# Patient Record
Sex: Male | Born: 1980 | Race: Black or African American | Hispanic: No | Marital: Single | Smoking: Never smoker
Health system: Southern US, Community
[De-identification: ages and names within clinical notes are randomized; demographics above are authoritative.]

## PROBLEM LIST (undated history)

## (undated) HISTORY — PX: ROOT CANAL: SHX2363

---
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
## 2001-04-04 ENCOUNTER — Encounter: Payer: Self-pay | Admitting: Emergency Medicine

## 2001-04-04 ENCOUNTER — Emergency Department (HOSPITAL_COMMUNITY): Admission: EM | Admit: 2001-04-04 | Discharge: 2001-04-04 | Payer: Self-pay | Admitting: Emergency Medicine

## 2005-01-18 ENCOUNTER — Emergency Department (HOSPITAL_COMMUNITY): Admission: EM | Admit: 2005-01-18 | Discharge: 2005-01-18 | Payer: Self-pay | Admitting: Emergency Medicine

## 2007-12-13 ENCOUNTER — Emergency Department (HOSPITAL_COMMUNITY): Admission: EM | Admit: 2007-12-13 | Discharge: 2007-12-13 | Payer: Self-pay | Admitting: Family Medicine

## 2008-03-11 ENCOUNTER — Emergency Department (HOSPITAL_COMMUNITY): Admission: EM | Admit: 2008-03-11 | Discharge: 2008-03-11 | Payer: Self-pay | Admitting: Emergency Medicine

## 2008-07-22 ENCOUNTER — Emergency Department (HOSPITAL_COMMUNITY): Admission: EM | Admit: 2008-07-22 | Discharge: 2008-07-23 | Payer: Self-pay | Admitting: Emergency Medicine

## 2011-04-19 NOTE — Discharge Summary (Signed)
[facility]  Patient:    Johnny Harper, Johnny Harper                    MRN: [id number] Proc. Date: 08/12/00 Adm. Date:  [id number] Disc. Date: [id number] Attending:  Gailen Shelter Dictator:   Earley Favor, RN, MSN, ACNP CC:         Adelene Amas. Williford, M.D.   Discharge Summary  DATE OF BIRTH:  09-Aug-1981  DISCHARGE DIAGNOSIS:  Overdose on tricycliates with acute respiratory failure.  PROCEDURE:  Orotracheal intubation on August 10, 2000, with orotracheal tube removal on August 11, 2000.  HISTORY OF PRESENT ILLNESS:  Mr. Stefanski is an 30 year old black male with a history of multiple overdoses, the last being three weeks ago a [facility].  He was brought to the [facility] Emergency Department by friends for acting funny and found to have a prescription bottle of doxepin, a tricycliate antidepressant in his pocket that was not prescribed for him.  He was noted to become stuporous and his airway was a question.  He was intubated by a ER physician, and placed on mechanical ventilatory support.  Pulmonary critical care medicine was asked to manage his care.  LABORATORY DATA:  Sodium 140, potassium 3.9, chloride is 104, CO2 is 26, BUN is 9, creatinine 1.1, glucose 99, calcium 9.7.  Hemoglobin 15.0, hematocrit 41.5, platelets are 243, WBC 6.2.  Sodium on September 11 was less than 10, ETOH level was less than 5, silicate level was less than 40.  Arterial blood gases on 40%, FIO2 on assist control 12, IVP equaled a tidal volume of 700.  A pH 7.45, pCO2 of 34, pO2 of 211.  Urinalysis:  Drug screen was positive for tricycliate antidepressant and tetrahydrocannabinol.  ALT is 11, ALP is 55. Total bilirubin 0.7.  CK is 153, CK-MB is 0.8.  Troponin I is 0.06.  A 12-lead ECG showed sinus rhythm and sinus bradycardia with no acute disease. Chest x-ray showed extubation increase in aeration and decrease in residual edema.  CT of the brain showed no  acute disease process.  HOSPITAL COURSE: #1 - TRICYCLIC OVERDOSE WITH ACUTE RESPIRATORY FAILURE:  Mr. Zywicki was quickly weaned and successfully extubated from the ventilator.  Urinalysis was positive for tetrahydrocannabinol and tricyclic antidepressants.  He responded to treatment and reached maximal hospital benefit by August 12, 2000, was discharged home.  #2 - DRUG OVERDOSE:  Mr. Hinton has a history of overdose beginning at age 59 and with a recent overdose at [facility] three weeks ago. Psychiatric evaluation by Dr. Jeanie Sewer was obtained.  The patient was found not be of danger to himself or others and could not be forcible committed.  It will be suggested that he followup with aggressive outpatient treatment, which he said he will be compliant.  He was discharged with an appointment to follow up at the [facility].  MEDICATIONS:  None.  FOLLOWUP:  [facility]  DIET:  As tolerated.  SPECIAL INSTRUCTIONS:  To refrain from drug overdosing.  DISPOSITION/CONDITION ON DISCHARGE:   His acute respiratory failure has been resolved. DD:  08/12/00 TD:  08/14/00 Job: [id number]

## 2011-08-27 LAB — CULTURE, ROUTINE-ABSCESS

## 2012-10-14 ENCOUNTER — Emergency Department (HOSPITAL_COMMUNITY)
Admission: EM | Admit: 2012-10-14 | Discharge: 2012-10-14 | Disposition: A | Discharge status: Home / Self Care | Attending: Emergency Medicine | Admitting: Emergency Medicine

## 2012-10-14 ENCOUNTER — Encounter (HOSPITAL_COMMUNITY): Payer: Self-pay | Admitting: *Deleted

## 2012-10-14 DIAGNOSIS — W208XXA Other cause of strike by thrown, projected or falling object, initial encounter: Secondary | ICD-10-CM | POA: Insufficient documentation

## 2012-10-14 DIAGNOSIS — Y93B3 Activity, free weights: Secondary | ICD-10-CM | POA: Insufficient documentation

## 2012-10-14 DIAGNOSIS — Y9210 Unspecified residential institution as the place of occurrence of the external cause: Secondary | ICD-10-CM | POA: Insufficient documentation

## 2012-10-14 DIAGNOSIS — S01119A Laceration without foreign body of unspecified eyelid and periocular area, initial encounter: Secondary | ICD-10-CM | POA: Insufficient documentation

## 2012-10-14 DIAGNOSIS — S0540XA Penetrating wound of orbit with or without foreign body, unspecified eye, initial encounter: Secondary | ICD-10-CM

## 2012-10-14 NOTE — ED Notes (Addendum)
Lac to lt eye , struck with dumb bell  When working out. No loc , has a headache.  Peri orbital contusion,

## 2012-10-14 NOTE — ED Provider Notes (Signed)
History     CSN: [id number]  Arrival date & time 10/14/12  Johnny Harper   First MD Initiated Contact with Patient 10/14/12 1914      Chief Complaint  Patient presents with  . 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 Patient states he was working out when a dumbbell fell on the left side of the face. He sustained a laceration of the upper eyelid and a laceration of the lower eyelid. He sustained a bruise to the lower orbital area. The patient denies any problems with his vision. The bleeding is controlled. The patient is up-to-date on his tetanus.  The history is provided by the patient.    History reviewed. No pertinent past medical history.  History reviewed. No pertinent past surgical history.  History reviewed. No pertinent family history.  History  Substance Use Topics  . Smoking status: Never Smoker   . Smokeless tobacco: Not on file  . Alcohol Use: No      Review of Systems  Constitutional: Negative for activity change.       All ROS Neg except as noted in HPI  HENT: Negative for nosebleeds and neck pain.   Eyes: Negative for photophobia and discharge.  Respiratory: Negative for cough, shortness of breath and wheezing.   Cardiovascular: Negative for chest pain and palpitations.  Gastrointestinal: Negative for abdominal pain and blood in stool.  Genitourinary: Negative for dysuria, frequency and hematuria.  Musculoskeletal: Negative for back pain and arthralgias.  Skin: Negative.   Neurological: Negative for dizziness, seizures and speech difficulty.  Psychiatric/Behavioral: Negative for hallucinations and confusion.    Allergies  Review of patient's allergies indicates no known allergies.  Home Medications  No current outpatient prescriptions on file.  BP 154/82  Pulse 51  Temp 97.9  F (36.6 C) (Oral)  Resp 20  Ht 5\' 8"  (1.727 m)  Wt 201 lb (91.173 kg)  BMI 30.56 kg/m2  SpO2 100%  Physical Exam  Nursing note and vitals reviewed. Constitutional: He is oriented to person, place, and time. He appears well-developed and well-nourished.  Non-toxic appearance.  HENT:  Head: Normocephalic.  Right Ear: Tympanic membrane and external ear normal.  Left Ear: Tympanic membrane and external ear normal.  Eyes: EOM and lids are normal. Pupils are equal, round, and reactive to light.         Pupils are equal and reactive to light the extraocular movements are intact. There is no palpable deformity of the left orbit. There is some bruising in the periorbital area. The anterior chamber is clear. There is no subconjunctival hemorrhage. There is a laceration of the upper eyelid and shallow  laceration of the lower eyelid. These are not through and through lacerations.  Neck: Normal range of motion. Neck supple. Carotid bruit is not present.  Cardiovascular: Normal rate, regular rhythm, normal heart sounds, intact distal pulses and normal pulses.   Pulmonary/Chest: Breath sounds normal. No respiratory distress.  Abdominal: Soft. Bowel sounds are normal. There is no tenderness. There is no guarding.  Musculoskeletal: Normal range of motion.  Lymphadenopathy:       Head (right side): No submandibular adenopathy present.       Head (left side): No submandibular adenopathy present.    He has no cervical adenopathy.  Neurological: He is alert and oriented to person, place, and time. He has normal strength. No cranial nerve deficit or sensory deficit.  Skin: Skin is warm and dry.  Psychiatric: He has a normal mood and affect. His speech is normal.    ED Course  Procedures LACERATION REPAIR - patient identified by arm band. Permission for the procedure given by the patient. Procedural time out taken before repair of lacerations to the upper eyelid and lower eyelid.  The laceration to  the upper eyelid measures 1.4 cm. The wound was cleansed with sure cleanse. No bone or major vascular involvement. It was repaired with Dermabond with good wound edge approximation. Patient tolerated the procedure without problem.  The laceration to the lower lid is right under the eyelashes. The wound was cleansed with sure cleanse. It is not a through and through laceration. The wound was repaired with a Steri-Strip. With good wound edge approximation. Patient tolerated the procedure without problem. The wound measures 2.2 cm.  Labs Reviewed - No data to display No results found.   No diagnosis found.    MDM  I have reviewed nursing notes, vital signs, and all appropriate lab and imaging results for this patient. Patient was working out when a barbell weight get him in the left orbit area. He sustained 2 lacerations that are not through and through. These were repaired with Dermabond and 1 with Steri-Strip. The tetanus is up-to-date. Patient advised to return if any signs of infection.       Kathie Dike, [place] 10/14/12 2007

## 2012-10-15 NOTE — ED Provider Notes (Signed)
Medical screening examination/treatment/procedure(s) were performed by non-physician practitioner and as supervising physician I was immediately available for consultation/collaboration.   Lyanne Co, MD 10/15/12 [phone]

## 2013-04-07 ENCOUNTER — Emergency Department (HOSPITAL_COMMUNITY): Payer: Self-pay

## 2013-04-07 ENCOUNTER — Emergency Department (HOSPITAL_COMMUNITY)
Admission: EM | Admit: 2013-04-07 | Discharge: 2013-04-08 | Disposition: A | Discharge status: Home / Self Care | Payer: Self-pay | Attending: Emergency Medicine | Admitting: Emergency Medicine

## 2013-04-07 ENCOUNTER — Encounter (HOSPITAL_COMMUNITY): Payer: Self-pay | Admitting: Emergency Medicine

## 2013-04-07 DIAGNOSIS — W298XXA Contact with other powered powered hand tools and household machinery, initial encounter: Secondary | ICD-10-CM | POA: Insufficient documentation

## 2013-04-07 DIAGNOSIS — Y9289 Other specified places as the place of occurrence of the external cause: Secondary | ICD-10-CM | POA: Insufficient documentation

## 2013-04-07 DIAGNOSIS — S81009A Unspecified open wound, unspecified knee, initial encounter: Secondary | ICD-10-CM | POA: Insufficient documentation

## 2013-04-07 DIAGNOSIS — S81011A Laceration without foreign body, right knee, initial encounter: Secondary | ICD-10-CM

## 2013-04-07 DIAGNOSIS — Y9389 Activity, other specified: Secondary | ICD-10-CM | POA: Insufficient documentation

## 2013-04-07 NOTE — ED Notes (Signed)
PT. ACCIDENTALLY HIT RIGHT KNEE WITH A HEDGE TRIMMER THIS AFTERNOON , PRESENTS WITH LACERATION APPROX. 1 1/2 [facility] AT RIGHT KNEE WITH MINIMAL BLEEDING . AMBULATORY. DRESSING APPLIED BY PT. PTA.

## 2013-04-08 MED ORDER — IBUPROFEN 800 MG PO TABS: 800.0000 mg | ORAL_TABLET | Freq: Three times a day (TID) | ORAL | Status: DC | PRN

## 2013-04-08 NOTE — ED Provider Notes (Signed)
History     CSN: [id number]  Arrival date & time 04/07/13  2245   First MD Initiated Contact with Patient 04/07/13 2332      Chief Complaint  Patient presents with  . Knee Injury   HPI  Hx provided by pt.  Pt is a 32 yo male with no sig. PMH who presents with right knee injury and laceration.  Pt was using a hedge trimmer and hit his right knee earlier this afternoon.  He has laceration with bleeding.  Pt cleaned the wound with water and peroxide.  Since that time he has had some increased pain around knee but has been ambulatory without difficulty.  He denies any weakness or numbness in foot.  Pt had tetanus in the past 1-2 years.  No other complaints.        History reviewed. No pertinent past medical history.  History reviewed. No pertinent past surgical history.  No family history on file.  History  Substance Use Topics  . Smoking status: Never Smoker   . Smokeless tobacco: Not on file  . Alcohol Use: No      Review of Systems  Neurological: Negative for weakness and numbness.  All other systems reviewed and are negative.    Allergies  Review of patient's allergies indicates no known allergies.  Home Medications  No current outpatient prescriptions on file.  BP 163/82  Pulse 58  Temp(Src) 98.1 F (36.7 C) (Oral)  Resp 15  SpO2 99%  Physical Exam  Nursing note and vitals reviewed. Constitutional: He is oriented to person, place, and time. He appears well-developed and well-nourished. No distress.  HENT:  Head: Normocephalic.  Cardiovascular: Normal rate and regular rhythm.   Pulmonary/Chest: Effort normal and breath sounds normal.  Musculoskeletal:  3 cm irregular laceration over anterior right knee.  No deep structure involvement through full ROM.  Normal distal sensations and pulses.    Neurological: He is alert and oriented to person, place, and time.  Skin: Skin is warm.  Psychiatric: He has a normal mood and affect. His behavior is normal.     ED Course  Procedures   LACERATION REPAIR Performed by: Angus Seller Authorized by: Angus Seller Consent: Verbal consent obtained. Risks and benefits: risks, benefits and alternatives were discussed Consent given by: patient Patient identity confirmed: provided demographic data Prepped and Draped in normal sterile fashion Wound explored  Laceration Location: right knee  Laceration Length: 3cm  No Foreign Bodies seen or palpated  Anesthesia: local infiltration  Local anesthetic: lidocaine 2% with epinephrine  Anesthetic total: 5 ml  Irrigation method: syringe Amount of cleaning: standard  Skin closure: Skin with 3-0 prolene  Number of sutures: 2  Technique: simple interrupted   Patient tolerance: Patient tolerated the procedure well with no immediate complications.    Dg Knee Complete 4 Views Right  04/07/2013  *RADIOLOGY REPORT*  Clinical Data: Laceration, blunt trauma  RIGHT KNEE - COMPLETE 4+ VIEW  Comparison:  Findings: No fracture or dislocation of the right knee.  No joint effusion.  Mild degenerative spurring of the patella.  IMPRESSION: 1. No acute findings of the right knee osseous structures.  2.  Laceration is not evident.  No radiodense foreign body.   Original Report Authenticated By: Genevive Bi, M.D.      1. Laceration of knee, right, initial encounter       MDM  11:30PM Pt seen and evaluated. Pt appears well in no acute distress.    X-rays reviewed.  No other concerning injuries.  Pt is ambulatory.  Full ROM.  Superficial laceration to right knee.          Angus Seller, PA-C 04/08/13 0028

## 2013-04-08 NOTE — ED Provider Notes (Signed)
Medical screening examination/treatment/procedure(s) were performed by non-physician practitioner and as supervising physician I was immediately available for consultation/collaboration.   Hanley Seamen, MD 04/08/13 [phone]

## 2013-07-27 ENCOUNTER — Emergency Department (HOSPITAL_COMMUNITY): Payer: Self-pay

## 2013-07-27 ENCOUNTER — Emergency Department (HOSPITAL_COMMUNITY)
Admission: EM | Admit: 2013-07-27 | Discharge: 2013-07-27 | Disposition: A | Discharge status: Home / Self Care | Payer: Self-pay | Attending: Emergency Medicine | Admitting: Emergency Medicine

## 2013-07-27 ENCOUNTER — Other Ambulatory Visit: Payer: Self-pay

## 2013-07-27 DIAGNOSIS — F411 Generalized anxiety disorder: Secondary | ICD-10-CM | POA: Insufficient documentation

## 2013-07-27 DIAGNOSIS — R079 Chest pain, unspecified: Secondary | ICD-10-CM | POA: Insufficient documentation

## 2013-07-27 DIAGNOSIS — R0682 Tachypnea, not elsewhere classified: Secondary | ICD-10-CM | POA: Insufficient documentation

## 2013-07-27 DIAGNOSIS — E872 Acidosis, unspecified: Secondary | ICD-10-CM | POA: Insufficient documentation

## 2013-07-27 DIAGNOSIS — F919 Conduct disorder, unspecified: Secondary | ICD-10-CM | POA: Insufficient documentation

## 2013-07-27 DIAGNOSIS — R61 Generalized hyperhidrosis: Secondary | ICD-10-CM | POA: Insufficient documentation

## 2013-07-27 DIAGNOSIS — Y939 Activity, unspecified: Secondary | ICD-10-CM | POA: Insufficient documentation

## 2013-07-27 DIAGNOSIS — S0100XA Unspecified open wound of scalp, initial encounter: Secondary | ICD-10-CM | POA: Insufficient documentation

## 2013-07-27 DIAGNOSIS — S0191XA Laceration without foreign body of unspecified part of head, initial encounter: Secondary | ICD-10-CM

## 2013-07-27 DIAGNOSIS — IMO0002 Reserved for concepts with insufficient information to code with codable children: Secondary | ICD-10-CM | POA: Insufficient documentation

## 2013-07-27 DIAGNOSIS — J984 Other disorders of lung: Secondary | ICD-10-CM | POA: Insufficient documentation

## 2013-07-27 DIAGNOSIS — Y929 Unspecified place or not applicable: Secondary | ICD-10-CM | POA: Insufficient documentation

## 2013-07-27 DIAGNOSIS — R000 Tachycardia, unspecified: Secondary | ICD-10-CM | POA: Insufficient documentation

## 2013-07-27 LAB — POCT I-STAT, CHEM 8
Chloride: 109 mEq/L (ref 96–112)
HCT: 53 % — ABNORMAL HIGH (ref 39.0–52.0)
Hemoglobin: 18 g/dL — ABNORMAL HIGH (ref 13.0–17.0)
Potassium: 3.5 mEq/L (ref 3.5–5.1)
Sodium: 141 mEq/L (ref 135–145)

## 2013-07-27 LAB — COMPREHENSIVE METABOLIC PANEL
ALT: 15 U/L (ref 0–53)
Alkaline Phosphatase: 61 U/L (ref 39–117)
BUN: 11 mg/dL (ref 6–23)
CO2: <7 mEq/L — CL (ref 19–32)
Chloride: 97 mEq/L (ref 96–112)
GFR calc Af Amer: 59 mL/min — ABNORMAL LOW (ref >90)
GFR calc non Af Amer: 51 mL/min — ABNORMAL LOW (ref >90)
Glucose, Bld: 139 mg/dL — ABNORMAL HIGH (ref 70–99)
Potassium: 3.7 mEq/L (ref 3.5–5.1)
Sodium: 141 mEq/L (ref 135–145)
Total Bilirubin: 0.4 mg/dL (ref 0.3–1.2)

## 2013-07-27 LAB — CG4 I-STAT (LACTIC ACID)
Lactic Acid, Venous: >20 mmol/L — ABNORMAL HIGH (ref 0.5–2.2)
Lactic Acid, Venous: 3 mmol/L — ABNORMAL HIGH (ref 0.5–2.2)

## 2013-07-27 LAB — CBC
HCT: 46.4 % (ref 39.0–52.0)
MCHC: 36.6 g/dL — ABNORMAL HIGH (ref 30.0–36.0)
Platelets: 266 10*3/uL (ref 150–400)
RDW: 13.3 % (ref 11.5–15.5)
WBC: 15.4 10*3/uL — ABNORMAL HIGH (ref 4.0–10.5)

## 2013-07-27 LAB — PROTIME-INR
INR: 1.21 (ref 0.00–1.49)
Prothrombin Time: 15 seconds (ref 11.6–15.2)

## 2013-07-27 LAB — SAMPLE TO BLOOD BANK

## 2013-07-27 MED ORDER — LORAZEPAM 2 MG/ML IJ SOLN
INTRAMUSCULAR | Status: AC
  Filled 2013-07-27: qty 1

## 2013-07-27 MED ORDER — SODIUM CHLORIDE 0.9 % IV BOLUS (SEPSIS)
1000.0000 mL | Freq: Once | INTRAVENOUS | Status: AC
  Administered 2013-07-27: 1000 mL via INTRAVENOUS

## 2013-07-27 MED ORDER — LORAZEPAM 2 MG/ML IJ SOLN: 1.0000 mg | Freq: Once | INTRAMUSCULAR | Status: DC

## 2013-07-27 MED ORDER — HYDROMORPHONE HCL PF 1 MG/ML IJ SOLN
1.0000 mg | Freq: Once | INTRAMUSCULAR | Status: AC
  Administered 2013-07-27: 1 mg via INTRAVENOUS
  Filled 2013-07-27: qty 1

## 2013-07-27 MED ORDER — IOHEXOL 300 MG/ML  SOLN
100.0000 mL | Freq: Once | INTRAMUSCULAR | Status: AC | PRN
  Administered 2013-07-27: 100 mL via INTRAVENOUS

## 2013-07-27 MED ORDER — SODIUM CHLORIDE 0.9 % IV BOLUS (SEPSIS)
500.0000 mL | Freq: Once | INTRAVENOUS | Status: AC
  Administered 2013-07-27: 500 mL via INTRAVENOUS

## 2013-07-27 MED ORDER — LORAZEPAM 2 MG/ML IJ SOLN
1.0000 mg | Freq: Once | INTRAMUSCULAR | Status: AC
  Administered 2013-07-27: 1 mg via INTRAVENOUS

## 2013-07-27 MED ORDER — HYDROCODONE-ACETAMINOPHEN 5-325 MG PO TABS: 1.0000 | ORAL_TABLET | Freq: Four times a day (QID) | ORAL | Status: DC | PRN
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
## 2013-07-27 NOTE — ED Notes (Signed)
Patient complaning of hearing loss in right ear. MD informed.

## 2013-07-27 NOTE — ED Notes (Signed)
Patient transported to CT scan . 
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
## 2013-07-27 NOTE — ED Notes (Signed)
CT Scan Continues.

## 2014-04-08 ENCOUNTER — Emergency Department (HOSPITAL_COMMUNITY)

## 2014-04-08 ENCOUNTER — Encounter (HOSPITAL_COMMUNITY): Payer: Self-pay | Admitting: Emergency Medicine

## 2014-04-08 ENCOUNTER — Emergency Department (HOSPITAL_COMMUNITY)
Admission: EM | Admit: 2014-04-08 | Discharge: 2014-04-08 | Disposition: A | Discharge status: Home / Self Care | Payer: Self-pay | Attending: Emergency Medicine | Admitting: Emergency Medicine

## 2014-04-08 DIAGNOSIS — S20219A Contusion of unspecified front wall of thorax, initial encounter: Secondary | ICD-10-CM | POA: Insufficient documentation

## 2014-04-08 DIAGNOSIS — S6990XA Unspecified injury of unspecified wrist, hand and finger(s), initial encounter: Secondary | ICD-10-CM

## 2014-04-08 DIAGNOSIS — S6991XA Unspecified injury of right wrist, hand and finger(s), initial encounter: Secondary | ICD-10-CM

## 2014-04-08 DIAGNOSIS — S59919A Unspecified injury of unspecified forearm, initial encounter: Secondary | ICD-10-CM

## 2014-04-08 DIAGNOSIS — S59909A Unspecified injury of unspecified elbow, initial encounter: Secondary | ICD-10-CM | POA: Insufficient documentation

## 2014-04-08 MED ORDER — IBUPROFEN 800 MG PO TABS
800.0000 mg | ORAL_TABLET | Freq: Once | ORAL | Status: AC
  Administered 2014-04-08: 800 mg via ORAL
  Filled 2014-04-08: qty 1

## 2014-04-08 MED ORDER — NAPROXEN 500 MG PO TABS: 500.0000 mg | ORAL_TABLET | Freq: Two times a day (BID) | ORAL | Status: DC

## 2014-04-08 MED ORDER — TRAMADOL HCL 50 MG PO TABS: 50.0000 mg | ORAL_TABLET | Freq: Four times a day (QID) | ORAL | Status: DC | PRN

## 2014-04-08 NOTE — ED Notes (Signed)
Right wrist is swollen and painful along with right thumb. Pt complaining of pain to left lower ribs. Lung sounds are clear.
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
## 2014-04-08 NOTE — Discharge Instructions (Signed)
Your xrays were negative for any fractures or dislocations. Read the instructions below carefully.  Apply ice several times a day. Take your medication with food.   Rib Contusion A rib contusion (bruise) can occur by a blow to the chest or by a fall against a hard object. Usually these will be much better in a couple weeks. If X-rays were taken today and there are no broken bones (fractures), the diagnosis of bruising is made. However, broken ribs may not show up for several days, or may be discovered later on a routine X-ray when signs of healing show up. If this happens to you, it does not mean that something was missed on the X-ray, but simply that it did not show up on the first X-rays. Earlier diagnosis will not usually change the treatment. HOME CARE INSTRUCTIONS   Avoid strenuous activity. Be careful during activities and avoid bumping the injured ribs. Activities that pull on the injured ribs and cause pain should be avoided, if possible.  For the first day or two, an ice pack used every 20 minutes while awake may be helpful. Put ice in a plastic bag and put a towel between the bag and the skin.  Eat a normal, well-balanced diet. Drink plenty of fluids to avoid constipation.  Take deep breaths several times a day to keep lungs free of infection. Try to cough several times a day. Splint the injured area with a pillow while coughing to ease pain. Coughing can help prevent pneumonia.  Wear a rib belt or binder only if told to do so by your caregiver. If you are wearing a rib belt or binder, you must do the breathing exercises as directed by your caregiver. If not used properly, rib belts or binders restrict breathing which can lead to pneumonia.  Only take over-the-counter or prescription medicines for pain, discomfort, or fever as directed by your caregiver. SEEK MEDICAL CARE IF:   You or your child has an oral temperature above 102 F (38.9 C).  Your baby is older than 3 months with  a rectal temperature of 100.5 F (38.1 C) or higher for more than 1 day.  You develop a cough, with thick or bloody sputum. SEEK IMMEDIATE MEDICAL CARE IF:   You have difficulty breathing.  You feel sick to your stomach (nausea), have vomiting or belly (abdominal) pain.  You have worsening pain, not controlled with medications, or there is a change in the location of the pain.  You develop sweating or radiation of the pain into the arms, jaw or shoulders, or become light headed or faint.  You or your child has an oral temperature above 102 F (38.9 C), not controlled by medicine.  Your or your baby is older than 3 months with a rectal temperature of 102 F (38.9 C) or higher.  Your baby is 863 months old or younger with a rectal temperature of 100.4 F (38 C) or higher. MAKE SURE YOU:   Understand these instructions.  Will watch your condition.  Will get help right away if you are not doing well or get worse. Document Released: 08/13/2001 Document Revised: 03/15/2013 Document Reviewed: 07/06/2008 [facility] Patient Information 2014 [place], [place].

## 2014-04-08 NOTE — ED Provider Notes (Signed)
Medical screening examination/treatment/procedure(s) were performed by non-physician practitioner and as supervising physician I was immediately available for consultation/collaboration.   EKG Interpretation None       Kenidy Crossland R. Mounir Skipper, MD 04/08/14 1553 

## 2014-04-20 ENCOUNTER — Emergency Department (HOSPITAL_COMMUNITY)

## 2014-04-20 ENCOUNTER — Emergency Department (HOSPITAL_COMMUNITY)
Admission: EM | Admit: 2014-04-20 | Discharge: 2014-04-20 | Disposition: A | Discharge status: Home / Self Care | Attending: Emergency Medicine | Admitting: Emergency Medicine

## 2014-04-20 ENCOUNTER — Encounter (HOSPITAL_COMMUNITY): Payer: Self-pay | Admitting: Emergency Medicine

## 2014-04-20 DIAGNOSIS — Y9289 Other specified places as the place of occurrence of the external cause: Secondary | ICD-10-CM | POA: Insufficient documentation

## 2014-04-20 DIAGNOSIS — IMO0002 Reserved for concepts with insufficient information to code with codable children: Secondary | ICD-10-CM | POA: Insufficient documentation

## 2014-04-20 DIAGNOSIS — S20219A Contusion of unspecified front wall of thorax, initial encounter: Secondary | ICD-10-CM | POA: Insufficient documentation

## 2014-04-20 DIAGNOSIS — S20212A Contusion of left front wall of thorax, initial encounter: Secondary | ICD-10-CM

## 2014-04-20 DIAGNOSIS — Y9389 Activity, other specified: Secondary | ICD-10-CM | POA: Insufficient documentation

## 2014-04-20 MED ORDER — HYDROCODONE-ACETAMINOPHEN 7.5-325 MG/15ML PO SOLN
10.0000 mL | Freq: Once | ORAL | Status: AC
  Administered 2014-04-20: 10 mL via ORAL
  Filled 2014-04-20: qty 15

## 2014-04-20 MED ORDER — HYDROCODONE-ACETAMINOPHEN 7.5-325 MG/15ML PO SOLN: 10.0000 mL | Freq: Four times a day (QID) | ORAL | Status: DC | PRN

## 2014-04-20 MED ORDER — IBUPROFEN 800 MG PO TABS: 800.0000 mg | ORAL_TABLET | Freq: Three times a day (TID) | ORAL | Status: DC

## 2014-04-20 NOTE — Discharge Instructions (Signed)
Please follow up with your primary care physician in 1-2 days. If you do not have one [facility] listed above. Please take pain medication and/or muscle relaxants as prescribed and as needed for pain. Please do not drive on narcotic pain medication or on muscle relaxants. Please take Motrin as prescribed. .jdpc  Rib Contusion A rib contusion (bruise) can occur by a blow to the chest or by a fall against a hard object. Usually these will be much better in a couple weeks. If X-rays were taken today and there are no broken bones (fractures), the diagnosis of bruising is made. However, broken ribs may not show up for several days, or may be discovered later on a routine X-ray when signs of healing show up. If this happens to you, it does not mean that something was missed on the X-ray, but simply that it did not show up on the first X-rays. Earlier diagnosis will not usually change the treatment. HOME CARE INSTRUCTIONS   Avoid strenuous activity. Be careful during activities and avoid bumping the injured ribs. Activities that pull on the injured ribs and cause pain should be avoided, if possible.  For the first day or two, an ice pack used every 20 minutes while awake may be helpful. Put ice in a plastic bag and put a towel between the bag and the skin.  Eat a normal, well-balanced diet. Drink plenty of fluids to avoid constipation.  Take deep breaths several times a day to keep lungs free of infection. Try to cough several times a day. Splint the injured area with a pillow while coughing to ease pain. Coughing can help prevent pneumonia.  Wear a rib belt or binder only if told to do so by your caregiver. If you are wearing a rib belt or binder, you must do the breathing exercises as directed by your caregiver. If not used properly, rib belts or binders restrict breathing which can lead to pneumonia.  Only take over-the-counter or prescription medicines for  pain, discomfort, or fever as directed by your caregiver. SEEK MEDICAL CARE IF:   You or your child has an oral temperature above 102 F (38.9 C).  Your baby is older than 3 months with a rectal temperature of 100.5 F (38.1 C) or higher for more than 1 day.  You develop a cough, with thick or bloody sputum. SEEK IMMEDIATE MEDICAL CARE IF:   You have difficulty breathing.  You feel sick to your stomach (nausea), have vomiting or belly (abdominal) pain.  You have worsening pain, not controlled with medications, or there is a change in the location of the pain.  You develop sweating or radiation of the pain into the arms, jaw or shoulders, or become light headed or faint.  You or your child has an oral temperature above 102 F (38.9 C), not controlled by medicine.  Your or your baby is older than 3 months with a rectal temperature of 102 F (38.9 C) or higher.  Your baby is 823 months old or younger with a rectal temperature of 100.4 F (38 C) or higher. MAKE SURE YOU:   Understand these instructions.  Will watch your condition.  Will get help right away if you are not doing well or get worse. Document Released: 08/13/2001 Document Revised: 03/15/2013 Document Reviewed: 07/06/2008 [facility] Patient Information 2014 [place], [place].
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
## 2014-04-23 NOTE — ED Provider Notes (Signed)
Medical screening examination/treatment/procedure(s) were performed by non-physician practitioner and as supervising physician I was immediately available for consultation/collaboration.   EKG Interpretation None        David H Yao, MD 04/23/14 0650 
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
## 2015-03-06 ENCOUNTER — Encounter (HOSPITAL_COMMUNITY): Payer: Self-pay

## 2015-03-06 ENCOUNTER — Emergency Department (HOSPITAL_COMMUNITY)
Admission: EM | Admit: 2015-03-06 | Discharge: 2015-03-07 | Disposition: A | Discharge status: Home / Self Care | Attending: Emergency Medicine | Admitting: Emergency Medicine

## 2015-03-06 DIAGNOSIS — Z7982 Long term (current) use of aspirin: Secondary | ICD-10-CM | POA: Diagnosis not present

## 2015-03-06 DIAGNOSIS — K088 Other specified disorders of teeth and supporting structures: Secondary | ICD-10-CM | POA: Diagnosis present

## 2015-03-06 DIAGNOSIS — Z791 Long term (current) use of non-steroidal anti-inflammatories (NSAID): Secondary | ICD-10-CM | POA: Insufficient documentation

## 2015-03-06 DIAGNOSIS — K0889 Other specified disorders of teeth and supporting structures: Secondary | ICD-10-CM

## 2015-03-06 NOTE — ED Notes (Signed)
Patient states left, upper dental pain starting 3-4 days ago. Pain radiates up to left side of head. Patient reports taking aspirin, but has not found relief.

## 2015-03-07 MED ORDER — KETOROLAC TROMETHAMINE 60 MG/2ML IM SOLN
60.0000 mg | Freq: Once | INTRAMUSCULAR | Status: AC
  Administered 2015-03-07: 60 mg via INTRAMUSCULAR
  Filled 2015-03-07: qty 2

## 2015-03-07 MED ORDER — PENICILLIN V POTASSIUM 500 MG PO TABS: 500.0000 mg | ORAL_TABLET | Freq: Four times a day (QID) | ORAL | Status: AC

## 2015-03-07 NOTE — Discharge Instructions (Signed)
Return to the emergency room with worsening of symptoms, new symptoms or with symptoms that are concerning, especially fevers, drooling, facial swelling, chest pain, shortness of breath, swelling of tongue or under tongue. Please take all of your antibiotics until finished!   You may develop abdominal discomfort or diarrhea from the antibiotic.  You may help offset this with probiotics which you can buy or get in yogurt. Do not eat  or take the probiotics until 2 hours after your antibiotic.  Call to make appointment as soon as possible with a dentist. Use the below resources. Ibuprofen 400mg  (2 tablets 200mg ) every 5-6 hours for 3-5 days. Read below information and follow recommendations.  Dental Pain A tooth ache may be caused by cavities (tooth decay). Cavities expose the nerve of the tooth to air and hot or cold temperatures. It may come from an infection or abscess (also called a boil or furuncle) around your tooth. It is also often caused by dental caries (tooth decay). This causes the pain you are having. DIAGNOSIS  Your caregiver can diagnose this problem by exam. TREATMENT   If caused by an infection, it may be treated with medications which kill germs (antibiotics) and pain medications as prescribed by your caregiver. Take medications as directed.  Only take over-the-counter or prescription medicines for pain, discomfort, or fever as directed by your caregiver.  Whether the tooth ache today is caused by infection or dental disease, you should see your dentist as soon as possible for further care. SEEK MEDICAL CARE IF: The exam and treatment you received today has been provided on an emergency basis only. This is not a substitute for complete medical or dental care. If your problem worsens or new problems (symptoms) appear, and you are unable to meet with your dentist, call or return to this location. SEEK IMMEDIATE MEDICAL CARE IF:   You have a fever.  You develop redness and  swelling of your face, jaw, or neck.  You are unable to open your mouth.  You have severe pain uncontrolled by pain medicine. MAKE SURE YOU:   Understand these instructions.  Will watch your condition.  Will get help right away if you are not doing well or get worse. Document Released: 11/18/2005 Document Revised: 02/10/2012 Document Reviewed: 07/06/2008 [facility] Patient Information 2015 [place] [place]. This information is not intended to replace advice given to you by your health care provider. Make sure you discuss any questions you have with your health care provider.    Emergency Department Resource Guide 1) Find a Doctor and Pay Out of Pocket Although you won't have to find out who is covered by your insurance plan, it is a good idea to ask around and get recommendations. You will then need to call the office and see if the doctor you have chosen will accept you as a new patient and what types of options they offer for patients who are self-pay. Some doctors offer discounts or will set up payment plans for their patients who do not have insurance, but you will need to ask so you aren't surprised when you get to your appointment.  2) Contact Your Local Health Department Not all health departments have doctors that can see patients for sick visits, but many do, so it is worth a call to see if yours does. If you don't know where your local health department is, you can check in your phone book. The CDC also has a tool to help you locate your state's health  department, and many state websites also have listings of all of their local health departments.  3) Find a Walk-in Clinic If your illness is not likely to be very severe or complicated, you may want to try a walk in clinic. These are popping up all over the country in pharmacies, drugstores, and shopping centers. They're usually staffed by nurse practitioners or physician assistants that have been trained to treat common illnesses and  complaints. They're usually fairly quick and inexpensive. However, if you have serious medical issues or chronic medical problems, these are probably not your best option.  No Primary Care Doctor: - Call Health Connect at  [phone] - they can help you locate a primary care doctor that  accepts your insurance, provides certain services, etc. - Physician Referral Service- [phone]  Chronic Pain Problems: Organization         Address  Phone   Notes  [facility]  [phone] Patients need to be referred by their primary care doctor.   Medication Assistance: Organization         Address  Phone   Notes  [facility] Medication [facility] [address] [place] [place], [place] [address] [phone] --Must be a resident of [facility] -- Must have NO insurance coverage whatsoever (no Medicaid/ Medicare, etc.) -- The pt. MUST have a primary care doctor that directs their care regularly and follows them in the community   [facility]  [phone]   [employer]  [phone]    Agencies that provide inexpensive medical care: Organization         Address  Phone   Notes  [facility]  [phone]   [facility]    [phone]   [facility] [address] [place], [place] [address] [phone]   [facility] [address], [place] [phone]   Planned Parenthood    [phone]   [facility]    [phone]   [facility] and [facility]  [address], [place] Phone:  [phone], Fax:  [phone] Hours of Operation:  9 am - 6 pm, M-F.  Also accepts Medicaid/Medicare and self-pay.  [facility]  [address], [place] Phone: [phone], Fax: [phone]. Hours of Operation:  8:30 am - 5:30 pm, M-F.  Also accepts Medicaid and self-pay.  [facility] [place] [address], [place] Phone: [phone]   [facility] [address] Natasha Bence [place], [place] [phone], Ext. [phone] Mondays & Thursdays: 7-9 AM.  First 15 patients are seen on a first come, first serve basis.    Medicaid-accepting [facility] Providers:  Organization         Address  Phone   Notes  [facility] [address], [place] [phone] Also accepts self-pay patients.  [facility] [address] Laurell Josephs [place], [place]  [phone]   [facility] [address], [place] [phone]   [facility] [address] [place] [phone]   Renaye Rakers [address], [place]   [phone] Only accepts [place] patients after they have their name applied to their card.   Self-Pay (no insurance) in [facility]:  Organization  Address  Phone   Notes  Sickle Cell Patients, [facility] [address], [place] [phone]   [facility] [address], [place] [phone]   [facility] [place]  [address], [place] [phone]   [facility]/Dr. Osei-Bonsu  [address] [place] [address], [place] or [phone], [place] [phone] Phone number for both [place] and [place] locations is the same.  [facility] and [facility] [address], [place] [phone]   [facility] [address] [place] [address], [place] or [address] [phone]   [facility] [address], [place] [phone], phone; [phone], fax Sees patients 1st and 3rd Saturday of every month.  Must not qualify for public or private insurance (i.e. Medicaid, Medicare, [place] Health Choice, Veterans' Benefits)  Household income should be no more than 200% of the poverty level  The clinic cannot treat you if you are pregnant or think you are pregnant  Sexually transmitted diseases are not treated at the clinic.    Dental Care: Organization         Address  Phone  Notes  [facility] [address], [place] [phone] Accepts children up to age 33 who are enrolled in [place] or [place] Health Choice; pregnant women with a Medicaid card; and children who have applied for Medicaid or [place] Health Choice, but were declined, whose parents can pay a reduced fee at time of service.  [facility] [place]  [address], [place] [phone]) [phone] Accepts children up to age 33 who are enrolled in [place] or  Health Choice; pregnant women with a Medicaid card; and children who have applied for Medicaid or  Health Choice, but were declined, whose parents can pay a reduced fee at time of service.  [facility]  [address], [place] [phone] Patients are seen by appointment only. Walk-ins are not accepted. [facility] will see patients 33 years of age and older. Monday - Tuesday (8am-5pm) Most Wednesdays (8:30-5pm) $30 per visit, cash only  [facility]  [address], [facility] [place] [phone] Patients are seen by appointment only. Walk-ins are not accepted. [facility] will see patients 33 years of age and older. One Wednesday Evening (Monthly: Volunteer Based).  $30 per visit, cash only  [employer] [facility] [facility]  [phone] for adults; Children under age 294, call Graduate Pediatric Dentistry at [phone]. Children aged 704-14, please call [phone] to request a pediatric application.  Dental services are provided in all areas of dental care including fillings, crowns and bridges, complete and partial dentures, implants, gum treatment, root canals, and extractions. Preventive care is  also provided. Treatment is provided to both adults and children. Patients are selected via a lottery and there is often a waiting list.   [facility] [address], [place]  [phone] [email or web address]   [facility] [address], [place], [place] [phone], Ext. [phone] Second and Fourth Thursday of each month, opens at 6:30 AM; Clinic ends at 9 AM.  Patients are seen on a first-come first-served basis, and a limited number are seen during each clinic.   [facility]  [address], [place], [employer] [phone]   Eligibility Requirements °You must have lived in [place], [place], or [place] counties for at least the last three months. °  You cannot be eligible for state or federal sponsored healthcare insurance, including Veterans Administration, Medicaid, or Medicare. °  You generally cannot be eligible for healthcare insurance through your employer.  °  How to apply: °Eligibility screenings are held every Tuesday and Wednesday afternoon from 1:00 pm until 4:00 pm. You do not need an appointment for the interview!  °[facility] [address], [place], [place] [phone]   °[facility]  336-342-8273   °[facility]  336-703-3100   °[facility]  336-570-6415   ° °Behavioral Health Resources in the Community: °Intensive Outpatient Programs °Organization         Address  Phone  Notes  °[place] [facility] [address], [place], [place] [phone]   °[facility] [address], [place], [place] 336-832-9800   °[facility] [address], [place], [place] ° 336-882-2125   °[facility] [address],  °[place], [place] 1-800-853-5163 or 336-641-4981   °Substance Abuse Resources °Organization         Address  Phone  Notes  °[facility]  336-882-2125   °[facility]   336-784-9470   °[facility]  336-285-9073   °[facility]  336-845-3988   °Residential & Outpatient Substance Abuse Program  1-800-659-3381   °Psychological Services °Organization         Address  Phone  Notes  °[facility]  336- 832-9600   °[facility]  336- 378-7881   °[facility] [address], [place] 1-800-853-5163 or 336-641-4981   ° °Mobile Crisis Teams °Organization         Address  Phone  Notes  °[facility], Mobile Crisis Care Unit  1-877-626-1772   °Assertive °[facility] Services ° [address] [place], [place] 336-834-9664   °Sharon DeEsch [address] °[place] 336-554-5454   ° °Self-Help/Support Groups °Organization         Address  Phone             Notes  °[facility] [place] - variety of support groups  336- 373-1402 Call for more information  °Narcotics Anonymous (NA), [facility] [address], °[place]  2 meetings at this location  ° °Residential Treatment Programs °Organization         Address  Phone  Notes  °[facility] [address],    °[place]  1-866-801-8205   °[facility] ° [address], [place], [place] 704-293-8524   °[facility] [address], [place] 336-845-3988 Admissions: 8am-3pm M-F  °[facility] [address]    °[place], [place] 336-841-1104   °[facility] [address], [place], [place] 336-379-7146   °[facility] [address]  °[place], [place] 336-285-9073   °[facility] - Intensive Outpatient [address], [place], [place] 336-852-3033   °[facility] ([facility] [address]  °[place], [place] 1-877-615-2722 or 336-784-9470   °[facility] ([facility]) [address] [place], [place] 336-227-7417 Accepts Medicaid  °[facility] [address]  °[place] 1-800-659-3381 Substance  Abuse/Addiction Treatment  ° °[place] [facility] Health Resources °Organization           Address  Phone  Notes  °[facility]  (888) 581-9988   °Julie Brannon, PhD [address] [place], [place]   (336) 349-5553 or (336) 951-0000   ° Behavioral   [address] °[place], [place] (336) 349-4454   °[facility] [address], [place], [place] (336) 342-8316 Insurance/Medicaid/sponsorship through [facility]  °[facility] [address]                                    [place], [place] (336) 342-8316 Therapy/tele-psych/case  °[facility] [address]  ° [place], [place] (336) 349-2233    °Dr. Arfeen  (336) 349-4544   °[facility] [place]  [employer] [place] [facility] 1) [address],  °2) [address], [place] °3)  [address], [place] (336) 349-3220 °(336) 342-7768 ° °(336) 342-8140   °[place] Child Abuse Hotline (336) 342-1394 or (336) 342-3537 (After Hours)    ° ° ° ° °

## 2015-03-07 NOTE — ED Notes (Signed)
Pt stable, ambulatory, pain 7/10, friend at bedside

## 2015-03-07 NOTE — ED Provider Notes (Signed)
CSN: [id number]     Arrival date & time 03/06/15  2227 History   First MD Initiated Contact with Patient 03/07/15 0000     Chief Complaint  Patient presents with  . Dental Pain     (Consider location/radiation/quality/duration/timing/severity/associated sxs/prior Treatment) HPI  Andrey CampanileWilliam S Highley is a 34 y.o. male presenting with left upper dental pain started 3-4 days ago with acute worsening last night he states the pain is sharp and radiates to left sided his head and ear. Pain is worse with eating. Better with as aspirin but no significant relief. Patient without history of diabetes no fevers or chills no nausea or vomiting no drooling facial swelling chest pain or shortness of breath   History reviewed. No pertinent past medical history. Past Surgical History  Procedure Laterality Date  . Root canal      2014, left upper   No family history on file. History  Substance Use Topics  . Smoking status: Never Smoker   . Smokeless tobacco: Not on file  . Alcohol Use: Yes    Review of Systems  Constitutional: Negative for fever and chills.  HENT: Positive for dental problem. Negative for drooling and facial swelling.   Respiratory: Negative for shortness of breath and stridor.   Cardiovascular: Negative for chest pain.  Gastrointestinal: Negative for nausea and vomiting.      Allergies  Review of patient's allergies indicates no known allergies.  Home Medications   Prior to Admission medications   Medication Sig Start Date End Date Taking? Authorizing Provider  aspirin 325 MG tablet Take 650 mg by mouth every 6 (six) hours as needed for mild pain or moderate pain.   Yes Historical Provider, MD  HYDROcodone-acetaminophen (HYCET) 7.5-325 mg/15 ml solution Take 10 mLs by mouth every 6 (six) hours as needed for severe pain. Patient not taking: Reported on 03/06/2015 04/20/14   Francee PiccoloJennifer Piepenbrink, PA-C  ibuprofen (ADVIL,MOTRIN) 800 MG tablet Take 1 tablet (800 mg total) by  mouth 3 (three) times daily. Patient not taking: Reported on 03/06/2015 04/20/14   Francee PiccoloJennifer Piepenbrink, PA-C  penicillin v potassium (VEETID) 500 MG tablet Take 1 tablet (500 mg total) by mouth 4 (four) times daily. 03/07/15 03/14/15  Oswaldo ConroyVictoria Sharma Lawrance, PA-C   BP 130/89 mmHg  Pulse 60  Temp(Src) 97.7 F (36.5 C) (Oral)  Resp 16  Ht 5' 8.5" (1.74 m)  Wt 175 lb (79.379 kg)  BMI 26.22 kg/m2  SpO2 99% Physical Exam  Constitutional: He appears well-developed and well-nourished. No distress.  HENT:  Head: Normocephalic and atraumatic.  Mouth/Throat: Oropharynx is clear and moist. No oropharyngeal exudate.  No trismus or uvula deviation No lip, tongue, facial swelling. No swelling under tongue or tenderness. Patient handling secretions. No drooling. Patient with poor dentition. Patient with tenderness to left right upper molar with mild erythema in this gingiva but no drainable abscess.   Eyes: Conjunctivae are normal. Right eye exhibits no discharge. Left eye exhibits no discharge.  Neck: Normal range of motion. Neck supple.  No neck masses or tenderness.  Cardiovascular: Normal rate and regular rhythm.   Pulmonary/Chest: Effort normal and breath sounds normal. No respiratory distress. He has no wheezes.  Abdominal: Soft. He exhibits no distension. There is no tenderness.  Lymphadenopathy:    He has no cervical adenopathy.  Neurological: He is alert. Coordination normal.  Skin: Skin is warm and dry. He is not diaphoretic.  Nursing note and vitals reviewed.   ED Course  Procedures (including critical care time)  Labs Review Labs Reviewed - No data to display  Imaging Review No results found.   EKG Interpretation None      MDM   Final diagnoses:  Pain, dental   Patient presenting with dental pain for 3 days. No history of diabetes. VSS. No drainable abscess. Patient started on penicillin and given ED resources to follow-up with the dentist. He is to use NSAIDs for pain relief.  Pain managed in ED.  Discussed return precautions with patient. Discussed all results and patient verbalizes understanding and agrees with plan.   Oswaldo Conroy, PA-C 03/07/15 1610  Gilda Crease, MD 03/07/15 [phone]

## 2015-05-08 ENCOUNTER — Emergency Department (HOSPITAL_COMMUNITY)
Admission: EM | Admit: 2015-05-08 | Discharge: 2015-05-09 | Disposition: A | Discharge status: Home / Self Care | Attending: Emergency Medicine | Admitting: Emergency Medicine

## 2015-05-08 ENCOUNTER — Encounter (HOSPITAL_COMMUNITY): Payer: Self-pay | Admitting: Emergency Medicine

## 2015-05-08 DIAGNOSIS — Y998 Other external cause status: Secondary | ICD-10-CM | POA: Insufficient documentation

## 2015-05-08 DIAGNOSIS — W260XXA Contact with knife, initial encounter: Secondary | ICD-10-CM | POA: Insufficient documentation

## 2015-05-08 DIAGNOSIS — Y9389 Activity, other specified: Secondary | ICD-10-CM | POA: Insufficient documentation

## 2015-05-08 DIAGNOSIS — Z720 Tobacco use: Secondary | ICD-10-CM | POA: Insufficient documentation

## 2015-05-08 DIAGNOSIS — Y929 Unspecified place or not applicable: Secondary | ICD-10-CM | POA: Insufficient documentation

## 2015-05-08 DIAGNOSIS — Z23 Encounter for immunization: Secondary | ICD-10-CM | POA: Insufficient documentation

## 2015-05-08 DIAGNOSIS — S81831A Puncture wound without foreign body, right lower leg, initial encounter: Secondary | ICD-10-CM | POA: Insufficient documentation

## 2015-05-08 MED ORDER — LIDOCAINE HCL (PF) 1 % IJ SOLN
2.0000 mL | Freq: Once | INTRAMUSCULAR | Status: AC
  Administered 2015-05-08: 2 mL
  Filled 2015-05-08: qty 5

## 2015-05-08 MED ORDER — BACITRACIN ZINC 500 UNIT/GM EX OINT
1.0000 "application " | TOPICAL_OINTMENT | CUTANEOUS | Status: AC
  Administered 2015-05-09: 1 via TOPICAL

## 2015-05-08 MED ORDER — AMOXICILLIN-POT CLAVULANATE 875-125 MG PO TABS: 1.0000 | ORAL_TABLET | Freq: Two times a day (BID) | ORAL | Status: DC

## 2015-05-08 MED ORDER — TETANUS-DIPHTH-ACELL PERTUSSIS 5-2.5-18.5 LF-MCG/0.5 IM SUSP
0.5000 mL | Freq: Once | INTRAMUSCULAR | Status: AC
  Administered 2015-05-08: 0.5 mL via INTRAMUSCULAR
  Filled 2015-05-08: qty 0.5

## 2015-05-08 MED ORDER — AMOXICILLIN-POT CLAVULANATE 875-125 MG PO TABS
1.0000 | ORAL_TABLET | Freq: Once | ORAL | Status: AC
  Administered 2015-05-09: 1 via ORAL
  Filled 2015-05-08: qty 1

## 2015-05-08 MED ORDER — IBUPROFEN 200 MG PO TABS
600.0000 mg | ORAL_TABLET | Freq: Once | ORAL | Status: AC
  Administered 2015-05-08: 600 mg via ORAL
  Filled 2015-05-08: qty 3

## 2015-05-08 MED ORDER — TRAMADOL HCL 50 MG PO TABS: 50.0000 mg | ORAL_TABLET | Freq: Four times a day (QID) | ORAL | Status: DC | PRN

## 2015-05-08 MED ORDER — IBUPROFEN 600 MG PO TABS: 600.0000 mg | ORAL_TABLET | Freq: Three times a day (TID) | ORAL | Status: DC

## 2015-05-08 NOTE — ED Notes (Signed)
Pt states he was playing with a new knife he got and it slipped out of his hand and stabbed him in the right calf area

## 2015-05-08 NOTE — ED Provider Notes (Signed)
CSN: [id number]     Arrival date & time 05/08/15  2218 History   None    This chart was scribed for non-physician practitioner, Earley FavorGail Kemari Narez, FNP working with Gerhard Munchobert Lockwood, MD by Arlan OrganAshley Leger, ED Scribe. This patient was seen in room WTR5/WTR5 and the patient's care was started at 10:43 PM.   Chief Complaint  Patient presents with  . Stab Wound   The history is provided by the patient. No language interpreter was used.    HPI Comments: Johnny Harper is a 34 y.o. male without any pertinent past medical history who presents to the Emergency Department complaining of a laceration to the R calf sustained just prior to arrival. Pt states he was playing with a new fishing knife when it slipped out of his hand resulting in a laceration to the R calf. He now c/o constant, ongoing, unchanged pain to the wound. Bleeding controlled at this time. No OTC medication or at home remedies/cleansing attempted prior to arrival. No recent fever or chills. No weakness, loss of sensation, numbness, or paresthesia. Pt unaware of Tetanus status at this time. No known allergies to medications.  History reviewed. No pertinent past medical history. Past Surgical History  Procedure Laterality Date  . Root canal      2014, left upper   Family History  Problem Relation Age of Onset  . Hypertension Other   . Diabetes Other    History  Substance Use Topics  . Smoking status: Current Some Day Smoker  . Smokeless tobacco: Not on file  . Alcohol Use: Yes     Comment: social    Review of Systems  Constitutional: Negative for fever and chills.  Gastrointestinal: Negative for nausea and vomiting.  Musculoskeletal: Positive for arthralgias.  Skin: Positive for wound.  Neurological: Negative for weakness and numbness.      Allergies  Review of patient's allergies indicates no known allergies.  Home Medications   Prior to Admission medications   Medication Sig Start Date End Date Taking? Authorizing  Provider  amoxicillin-clavulanate (AUGMENTIN) 875-125 MG per tablet Take 1 tablet by mouth 2 (two) times daily. 05/09/15   Earley FavorGail Brizza Nathanson, NP  aspirin 325 MG tablet Take 650 mg by mouth every 6 (six) hours as needed for mild pain or moderate pain.    Historical Provider, MD  HYDROcodone-acetaminophen (HYCET) 7.5-325 mg/15 ml solution Take 10 mLs by mouth every 6 (six) hours as needed for severe pain. Patient not taking: Reported on 03/06/2015 04/20/14   Francee PiccoloJennifer Piepenbrink, PA-C  ibuprofen (ADVIL,MOTRIN) 600 MG tablet Take 1 tablet (600 mg total) by mouth 3 (three) times daily. 05/08/15   Earley FavorGail Zayvian Mcmurtry, NP  traMADol (ULTRAM) 50 MG tablet Take 1 tablet (50 mg total) by mouth every 6 (six) hours as needed. 05/08/15   Earley FavorGail Joli Koob, NP   Triage Vitals: BP 148/97 mmHg  Pulse 93  Temp(Src) 98 F (36.7 C) (Oral)  Resp 22  SpO2 98%   Physical Exam  Constitutional: He is oriented to person, place, and time. He appears well-developed and well-nourished.  HENT:  Head: Normocephalic.  Eyes: EOM are normal.  Neck: Normal range of motion.  Pulmonary/Chest: Effort normal.  Abdominal: He exhibits no distension.  Musculoskeletal: Normal range of motion.  Neurological: He is alert and oriented to person, place, and time.  Skin:  1.5 cm linear laceration approximately 0.5 cm in depth Surrounding swelling noted   Psychiatric: He has a normal mood and affect.  Nursing note and vitals reviewed.  ED Course  Procedures (including critical care time)  DIAGNOSTIC STUDIES: Oxygen Saturation is 98% on RA, Normal by my interpretation.    COORDINATION OF CARE: 10:44 PM- Will give Boostrix. Will perform laceration repair. Discussed treatment plan with pt at bedside and pt agreed to plan.     LACERATION REPAIR Performed by: Earley Favor, FNP  Consent: Verbal consent obtained. Risks and benefits: risks, benefits and alternatives were discussed Patient identity confirmed: provided demographic data Time out performed  prior to procedure Prepped and Draped in normal sterile fashion Wound explored Laceration Location: R lateral calf Laceration Length: 1.5 cm No Foreign Bodies seen or palpated Anesthesia: local infiltration Local anesthetic: lidocaine 1% without epinephrine Anesthetic total: 2 ml Irrigation method: syringe Amount of cleaning: standard Skin closure: 4-0 Prolene Number of sutures or staples: 2 Technique: Simple Interrupted  Patient tolerance: Patient tolerated the procedure well with no immediate complications.   Labs Review Labs Reviewed - No data to display  Imaging Review No results found.   EKG Interpretation None      MDM   Final diagnoses:  Puncture wound of leg excluding thigh, right, initial encounter   I personally performed the services described in this documentation, which was scribed in my presence. The recorded information has been reviewed and is accurate.  Earley Favor, NP 05/09/15 0004  Paula Libra, MD 05/09/15 [phone]

## 2015-05-08 NOTE — Discharge Instructions (Signed)
Your wound was sutured loosely to allow any sign of infection to present quickly.  Please watch the area for increased pain, swelling, pus, redness, U been given any antiemetics to prevent infection.  Prescriptions for pain control as well.  If you see any sign of infection, such as redness, pain, swelling of fever.  Please return immediately to the emergency room for further evaluation

## 2015-09-07 ENCOUNTER — Encounter (HOSPITAL_COMMUNITY): Payer: Self-pay | Admitting: *Deleted

## 2015-09-07 ENCOUNTER — Emergency Department (HOSPITAL_COMMUNITY)

## 2015-09-07 ENCOUNTER — Emergency Department (HOSPITAL_COMMUNITY)
Admission: EM | Admit: 2015-09-07 | Discharge: 2015-09-07 | Disposition: A | Discharge status: Home / Self Care | Attending: Emergency Medicine | Admitting: Emergency Medicine

## 2015-09-07 DIAGNOSIS — E860 Dehydration: Secondary | ICD-10-CM

## 2015-09-07 DIAGNOSIS — R079 Chest pain, unspecified: Secondary | ICD-10-CM | POA: Insufficient documentation

## 2015-09-07 DIAGNOSIS — G4489 Other headache syndrome: Secondary | ICD-10-CM | POA: Insufficient documentation

## 2015-09-07 DIAGNOSIS — F1910 Other psychoactive substance abuse, uncomplicated: Secondary | ICD-10-CM

## 2015-09-07 DIAGNOSIS — Z7982 Long term (current) use of aspirin: Secondary | ICD-10-CM | POA: Insufficient documentation

## 2015-09-07 DIAGNOSIS — Z720 Tobacco use: Secondary | ICD-10-CM | POA: Insufficient documentation

## 2015-09-07 LAB — CBC
HEMATOCRIT: 45.6 % (ref 39.0–52.0)
Hemoglobin: 16.4 g/dL (ref 13.0–17.0)
MCH: 31.9 pg (ref 26.0–34.0)
MCHC: 36 g/dL (ref 30.0–36.0)
MCV: 88.7 fL (ref 78.0–100.0)
Platelets: 251 10*3/uL (ref 150–400)
RBC: 5.14 MIL/uL (ref 4.22–5.81)
RDW: 12.5 % (ref 11.5–15.5)
WBC: 7 10*3/uL (ref 4.0–10.5)

## 2015-09-07 LAB — BASIC METABOLIC PANEL
Anion gap: 19 — ABNORMAL HIGH (ref 5–15)
BUN: 14 mg/dL (ref 6–20)
CO2: 16 mmol/L — AB (ref 22–32)
Calcium: 9.4 mg/dL (ref 8.9–10.3)
Chloride: 105 mmol/L (ref 101–111)
Creatinine, Ser: 1.17 mg/dL (ref 0.61–1.24)
GFR calc Af Amer: >60 mL/min (ref >60)
GFR calc non Af Amer: >60 mL/min (ref >60)
Glucose, Bld: 71 mg/dL (ref 65–99)
POTASSIUM: 3.9 mmol/L (ref 3.5–5.1)
SODIUM: 140 mmol/L (ref 135–145)

## 2015-09-07 LAB — I-STAT TROPONIN, ED
TROPONIN I, POC: 0 ng/mL (ref 0.00–0.08)
Troponin i, poc: 0 ng/mL (ref 0.00–0.08)

## 2015-09-07 LAB — ETHANOL: Alcohol, Ethyl (B): 142 mg/dL — ABNORMAL HIGH (ref <5)

## 2015-09-07 LAB — CK: Total CK: 320 U/L (ref 49–397)

## 2015-09-07 LAB — CBG MONITORING, ED: GLUCOSE-CAPILLARY: 76 mg/dL (ref 65–99)

## 2015-09-07 MED ORDER — SODIUM CHLORIDE 0.9 % IV BOLUS (SEPSIS)
1000.0000 mL | Freq: Once | INTRAVENOUS | Status: AC
  Administered 2015-09-07: 1000 mL via INTRAVENOUS

## 2015-09-07 MED ORDER — ONDANSETRON HCL 4 MG/2ML IJ SOLN
4.0000 mg | Freq: Once | INTRAMUSCULAR | Status: AC
  Administered 2015-09-07: 4 mg via INTRAVENOUS
  Filled 2015-09-07: qty 2

## 2015-09-07 NOTE — ED Notes (Signed)
CBG 76 

## 2015-09-07 NOTE — Discharge Instructions (Signed)
Your caregiver has diagnosed you as having chest pain that is not specific for one problem, but does not require admission.  Chest pain comes from many different causes.  °SEEK IMMEDIATE MEDICAL ATTENTION IF: °You have severe chest pain, especially if the pain is crushing or pressure-like and spreads to the arms, back, neck, or jaw, or if you have sweating, nausea (feeling sick to your stomach), or shortness of breath. THIS IS AN EMERGENCY. Don't wait to see if the pain will go away. Get medical help at once. Call 911 or 0 (operator). DO NOT drive yourself to the hospital.  °Your chest pain gets worse and does not go away with rest.  °You have an attack of chest pain lasting longer than usual, despite rest and treatment with the medications your caregiver has prescribed.  °You wake from sleep with chest pain or shortness of breath.  °You feel dizzy or faint.  °You have chest pain not typical of your usual pain for which you originally saw your caregiver. ° °You are having a headache. No specific cause was found today for your headache. It may have been a migraine or other cause of headache. Stress, anxiety, fatigue, and depression are common triggers for headaches. Your headache today does not appear to be life-threatening or require hospitalization, but often the exact cause of headaches is not determined in the emergency department. Therefore, follow-up with your doctor is very important to find out what may have caused your headache, and whether or not you need any further diagnostic testing or treatment. Sometimes headaches can appear benign (not harmful), but then more serious symptoms can develop which should prompt an immediate re-evaluation by your doctor or the emergency department. ° °SEEK MEDICAL ATTENTION IF: ° °You develop possible problems with medications prescribed.  °The medications don't resolve your headache, if it recurs , or if you have multiple episodes of vomiting or can't take fluids. °You  have a change from the usual headache. ° °RETURN IMMEDIATELY IF you develop a sudden, severe headache or confusion, become poorly responsive or faint, develop a fever above 100.4F or problem breathing, have a change in speech, vision, swallowing, or understanding, or develop new weakness, numbness, tingling, incoordination, or have a seizure. ° °

## 2015-09-07 NOTE — ED Provider Notes (Signed)
CSN: [id number]     Arrival date & time 09/07/15  4098 History  By signing my name below, I, Freida Busman, attest that this documentation has been prepared under the direction and in the presence of Zadie Rhine, MD . Electronically Signed: Freida Busman, Scribe. 09/07/2015. 4:00 AM.    Chief Complaint  Patient presents with  . Chest Pain  LEVEL 5 CAVEAT DUE TO altered mental status   The history is provided by a significant other. No language interpreter was used.    HPI Comments:  Johnny Harper is a 34 y.o. male brought in by ambulance, who presents to the Emergency Department with girlfriend who reports CP that began this am.  Girlfriend states pt used cocaine ~ 2000 last night, drank a lot of beer and liquor and took a tramadol. She states he was laying in bed when he began vomiting nonbloody emesis and complaining of CP. He later "passed out" for brief period of time. She denies convulsion/seizure like activity.   No other details are known at arrival   PMH - none Soc hx - polysubstance abuse  Past Surgical History  Procedure Laterality Date  . Root canal      2014, left upper   Family History  Problem Relation Age of Onset  . Hypertension Other   . Diabetes Other    Social History  Substance Use Topics  . Smoking status: Current Some Day Smoker  . Smokeless tobacco: None  . Alcohol Use: Yes     Comment: social    Review of Systems  Unable to perform ROS: Mental status change     Allergies  Review of patient's allergies indicates no known allergies.  Home Medications   Prior to Admission medications   Medication Sig Start Date End Date Taking? Authorizing Provider  aspirin 325 MG tablet Take 650 mg by mouth every 6 (six) hours as needed for mild pain or moderate pain.    Historical Provider, MD   BP 128/78 mmHg  Pulse 70  Resp 18  SpO2 94% Physical Exam CONSTITUTIONAL: Somnolent  HEAD: Normocephalic/atraumatic EYES: EOMI/PERRL ENMT: Mucous  membranes moist NECK: supple no meningeal signs SPINE/BACK:entire spine nontender CV: S1/S2 noted, no murmurs/rubs/gallops noted LUNGS: Lungs are clear to auscultation bilaterally, no apparent distress ABDOMEN: soft, nontender, no rebound or guarding, bowel sounds noted throughout abdomen GU:no cva tenderness NEURO: Pt is somnolent but arousable , moves all extremitiesx4.  No facial droop.   EXTREMITIES: pulses normal/equal, full ROM SKIN: warm, color normal PSYCH: unable to assess  ED Course  Procedures   DIAGNOSTIC STUDIES:  Oxygen Saturation is 94% on RA, adequate by my interpretation.    4:41 AM Pt with alcohol and drug abuse here with reported CP  He appears intoxicated, but is stable and in no distress Will monitor in ED He is noted to be dehydrated, IV fluids ordered 6:06 AM Pt improved Denies further CP He reports HA but feels like it is likely due to " a hangover" He is in no distress at this time 7:20 AM Pt improved He is awake/alert. Ambulatory without difficulty He reports most of his symptoms resolved I feel he is stable for d/c home We discussed need to stop drug/etoh use   Labs Review Labs Reviewed  BASIC METABOLIC PANEL - Abnormal; Notable for the following:    CO2 16 (*)    Anion gap 19 (*)    All other components within normal limits  ETHANOL - Abnormal; Notable for the  following:    Alcohol, Ethyl (B) 142 (*)    All other components within normal limits  CBC  CK  I-STAT TROPOININ, ED  CBG MONITORING, ED  I-STAT TROPOININ, ED    Imaging Review Dg Chest Portable 1 View  09/07/2015   CLINICAL DATA:  Acute onset of central chest pain, radiating to the right arm. Initial encounter.  EXAM: PORTABLE CHEST 1 VIEW  COMPARISON:  Chest radiograph from 04/20/2014  FINDINGS: The lungs are well-aerated and clear. There is no evidence of focal opacification, pleural effusion or pneumothorax.  The cardiomediastinal silhouette is within normal limits. No acute  osseous abnormalities are seen.  IMPRESSION: No acute cardiopulmonary process seen.   Electronically Signed   By: Roanna Raider M.D.   On: 09/07/2015 04:23   I have personally reviewed and evaluated these  lab results as part of my medical decision-making.   EKG Interpretation   Date/Time:  Thursday September 07 2015 03:44:13 EDT Ventricular Rate:  78 PR Interval:  183 QRS Duration: 108 QT Interval:  391 QTC Calculation: 445 R Axis:   79 Text Interpretation:  Sinus rhythm Ventricular premature complex Consider  left atrial enlargement ST elev, probable normal early repol pattern  Confirmed by Bebe Shaggy  MD, Yoltzin Barg ([id number]) on 09/07/2015 3:49:36 AM      Medications  ondansetron (ZOFRAN) injection 4 mg (4 mg Intravenous Given 09/07/15 0437)  sodium chloride 0.9 % bolus 1,000 mL (0 mLs Intravenous Stopped 09/07/15 0645)  sodium chloride 0.9 % bolus 1,000 mL (0 mLs Intravenous Stopped 09/07/15 0645)     MDM   Final diagnoses:  Chest pain, unspecified chest pain type  Polysubstance abuse  Other headache syndrome  Dehydration    Nursing notes including past medical history and social history reviewed and considered in documentation Labs/vital reviewed myself and considered during evaluation    I, Joya Gaskins, personally performed the services described in this documentation. All medical record entries made by the scribe were at my direction and in my presence.  I have reviewed the chart  and agree that the record reflects my personal performance and is accurate and complete. Joya Gaskins.  09/07/2015. 4:41 AM.      Zadie Rhine, MD 09/07/15 [phone]

## 2015-09-07 NOTE — ED Notes (Signed)
Pt. Did cocaine, drank alcohol, and took tramadol tonight. Pt has central chest pain radiating to right arm

## 2015-09-07 NOTE — ED Notes (Signed)
Pt. Ambulated with out difficultly.

## 2015-09-07 NOTE — ED Notes (Signed)
Pt acknowledges taking all belongings home. 

## 2015-10-16 ENCOUNTER — Emergency Department (HOSPITAL_COMMUNITY)
Admission: EM | Admit: 2015-10-16 | Discharge: 2015-10-17 | Disposition: A | Discharge status: Other Acute Inpatient Hospital | Attending: Emergency Medicine | Admitting: Emergency Medicine

## 2015-10-16 ENCOUNTER — Emergency Department (HOSPITAL_COMMUNITY)

## 2015-10-16 ENCOUNTER — Encounter (HOSPITAL_COMMUNITY): Payer: Self-pay | Admitting: Emergency Medicine

## 2015-10-16 DIAGNOSIS — R05 Cough: Secondary | ICD-10-CM | POA: Insufficient documentation

## 2015-10-16 DIAGNOSIS — F1910 Other psychoactive substance abuse, uncomplicated: Secondary | ICD-10-CM

## 2015-10-16 DIAGNOSIS — F1994 Other psychoactive substance use, unspecified with psychoactive substance-induced mood disorder: Secondary | ICD-10-CM | POA: Diagnosis present

## 2015-10-16 DIAGNOSIS — F17200 Nicotine dependence, unspecified, uncomplicated: Secondary | ICD-10-CM | POA: Insufficient documentation

## 2015-10-16 DIAGNOSIS — F1410 Cocaine abuse, uncomplicated: Secondary | ICD-10-CM | POA: Insufficient documentation

## 2015-10-16 DIAGNOSIS — F1210 Cannabis abuse, uncomplicated: Secondary | ICD-10-CM | POA: Insufficient documentation

## 2015-10-16 DIAGNOSIS — F1310 Sedative, hypnotic or anxiolytic abuse, uncomplicated: Secondary | ICD-10-CM | POA: Insufficient documentation

## 2015-10-16 DIAGNOSIS — F1010 Alcohol abuse, uncomplicated: Secondary | ICD-10-CM | POA: Insufficient documentation

## 2015-10-16 LAB — COMPREHENSIVE METABOLIC PANEL
ALT: 20 U/L (ref 17–63)
ANION GAP: 11 (ref 5–15)
AST: 32 U/L (ref 15–41)
Albumin: 4.4 g/dL (ref 3.5–5.0)
Alkaline Phosphatase: 54 U/L (ref 38–126)
BILIRUBIN TOTAL: 0.6 mg/dL (ref 0.3–1.2)
BUN: 7 mg/dL (ref 6–20)
CHLORIDE: 104 mmol/L (ref 101–111)
CO2: 24 mmol/L (ref 22–32)
Calcium: 9.4 mg/dL (ref 8.9–10.3)
Creatinine, Ser: 0.97 mg/dL (ref 0.61–1.24)
GFR calc Af Amer: >60 mL/min (ref >60)
Glucose, Bld: 139 mg/dL — ABNORMAL HIGH (ref 65–99)
POTASSIUM: 3.4 mmol/L — AB (ref 3.5–5.1)
Sodium: 139 mmol/L (ref 135–145)
TOTAL PROTEIN: 7.8 g/dL (ref 6.5–8.1)

## 2015-10-16 LAB — CBC
HEMATOCRIT: 42 % (ref 39.0–52.0)
HEMOGLOBIN: 15.2 g/dL (ref 13.0–17.0)
MCH: 31.9 pg (ref 26.0–34.0)
MCHC: 36.2 g/dL — AB (ref 30.0–36.0)
MCV: 88.2 fL (ref 78.0–100.0)
Platelets: 274 10*3/uL (ref 150–400)
RBC: 4.76 MIL/uL (ref 4.22–5.81)
RDW: 13.1 % (ref 11.5–15.5)
WBC: 6.2 10*3/uL (ref 4.0–10.5)

## 2015-10-16 LAB — RAPID URINE DRUG SCREEN, HOSP PERFORMED
AMPHETAMINES: NOT DETECTED
BARBITURATES: NOT DETECTED
BENZODIAZEPINES: POSITIVE — AB
COCAINE: POSITIVE — AB
OPIATES: NOT DETECTED
TETRAHYDROCANNABINOL: POSITIVE — AB

## 2015-10-16 LAB — ETHANOL

## 2015-10-16 MED ORDER — LORAZEPAM 1 MG PO TABS: 0.0000 mg | ORAL_TABLET | Freq: Four times a day (QID) | ORAL | Status: DC

## 2015-10-16 MED ORDER — IBUPROFEN 200 MG PO TABS: 600.0000 mg | ORAL_TABLET | Freq: Three times a day (TID) | ORAL | Status: DC | PRN

## 2015-10-16 MED ORDER — LORAZEPAM 1 MG PO TABS: 0.0000 mg | ORAL_TABLET | Freq: Two times a day (BID) | ORAL | Status: DC

## 2015-10-16 MED ORDER — VITAMIN B-1 100 MG PO TABS
100.0000 mg | ORAL_TABLET | Freq: Every day | ORAL | Status: DC
  Administered 2015-10-16: 100 mg via ORAL
  Filled 2015-10-16: qty 1

## 2015-10-16 MED ORDER — ACETAMINOPHEN 325 MG PO TABS: 650.0000 mg | ORAL_TABLET | ORAL | Status: DC | PRN

## 2015-10-16 MED ORDER — THIAMINE HCL 100 MG/ML IJ SOLN: 100.0000 mg | Freq: Every day | INTRAMUSCULAR | Status: DC

## 2015-10-16 NOTE — ED Provider Notes (Signed)
CSN: [id number]     Arrival date & time 10/16/15  1537 History   First MD Initiated Contact with Patient 10/16/15 1624     Chief Complaint  Patient presents with  . Suicidal  . detox      (Consider location/radiation/quality/duration/timing/severity/associated sxs/prior Treatment) HPI  34 year old male presents requesting detox from multiple drugs. He has been abusing cocaine, marijuana, MALE, and alcohol. When asked how much alcohol he drinks she states "too much" and drinks every day. Last abuse this morning. Patient states that this morning he was feeling suicidal and planned to just keep abusing drugs until he died. He is feeling a little better from that perspective but still feels like there is no point living if he keeps abusing drugs like this. Patient denies fevers, shortness of breath, or chest pain. However he has had a productive cough "for a while", denies any hemoptysis.  History reviewed. No pertinent past medical history. Past Surgical History  Procedure Laterality Date  . Root canal      2014, left upper   Family History  Problem Relation Age of Onset  . Hypertension Other   . Diabetes Other    Social History  Substance Use Topics  . Smoking status: Current Some Day Smoker  . Smokeless tobacco: None  . Alcohol Use: Yes     Comment: social    Review of Systems  Constitutional: Negative for fever.  Respiratory: Positive for cough. Negative for shortness of breath.   Cardiovascular: Negative for chest pain.  Psychiatric/Behavioral: Positive for suicidal ideas.  All other systems reviewed and are negative.     Allergies  Review of patient's allergies indicates no known allergies.  Home Medications   Prior to Admission medications   Not on File   BP 166/91 mmHg  Pulse 71  Temp(Src) 98.7 F (37.1 C) (Oral)  Resp 18  SpO2 99% Physical Exam  Constitutional: He is oriented to person, place, and time. He appears well-developed and well-nourished.   HENT:  Head: Normocephalic and atraumatic.  Right Ear: External ear normal.  Left Ear: External ear normal.  Nose: Nose normal.  Eyes: Right eye exhibits no discharge. Left eye exhibits no discharge.  Neck: Neck supple.  Cardiovascular: Normal rate, regular rhythm, normal heart sounds and intact distal pulses.   Pulmonary/Chest: Effort normal and breath sounds normal. He has no wheezes. He has no rales.  Abdominal: Soft. There is no tenderness.  Musculoskeletal: He exhibits no edema.  Neurological: He is alert and oriented to person, place, and time.  Skin: Skin is warm and dry.  Psychiatric: His mood appears not anxious. His speech is not slurred. He is not agitated. He does not exhibit a depressed mood. He expresses suicidal ideation. He expresses suicidal plans.  Nursing note and vitals reviewed.   ED Course  Procedures (including critical care time) Labs Review Labs Reviewed  CBC - Abnormal; Notable for the following:    MCHC 36.2 (*)    All other components within normal limits  URINE RAPID DRUG SCREEN, HOSP PERFORMED - Abnormal; Notable for the following:    Cocaine POSITIVE (*)    Benzodiazepines POSITIVE (*)    Tetrahydrocannabinol POSITIVE (*)    All other components within normal limits  ETHANOL  COMPREHENSIVE METABOLIC PANEL    Imaging Review Dg Chest 2 View  10/16/2015  CLINICAL DATA:  Pt complains of productive cough x 2-3 weeks. Smoker. Shielded pt. EXAM: CHEST - 2 VIEW COMPARISON:  09/07/2015 FINDINGS: Lungs are clear.  Heart size and mediastinal contours are within normal limits. No effusion. Visualized skeletal structures are unremarkable. IMPRESSION: No acute cardiopulmonary disease. Electronically Signed   By: Corlis Leak M.D.   On: 10/16/2015 17:36   I have personally reviewed and evaluated these images and lab results as part of my medical decision-making.   EKG Interpretation None      MDM   Final diagnoses:  Drug abuse    Given patient is  suicidal, TTS has been consulted. He is medically cleared. Chest x-ray shows no pneumonia as a cause of his chronic cough.    Pricilla Loveless, MD 10/17/15 [phone]

## 2015-10-16 NOTE — ED Notes (Signed)
Pt AAO x 3, no distress noted, remains SI if he does not get detox help.  Friend at bedside, visiting with pt.  Monitoring for safety, Q 15 min checks in effect.

## 2015-10-16 NOTE — ED Notes (Signed)
Pt family has pt belongings

## 2015-10-16 NOTE — ED Notes (Signed)
Pt oriented to room and unit.  Pt contracts for safety.    Pt is alert and oriented.  15 minute checks and video monitoring in place.

## 2015-10-16 NOTE — ED Notes (Signed)
p states that he is addicted to drugs, "weed, cocaine, all types" and he needs help today getting off them or "tomorrow won't be good".  Pt states that he has plan to kill himself by overdosing on drugs if he doesn't get help. Pt states that he hasnt had sleep in "a while".

## 2015-10-17 ENCOUNTER — Encounter (HOSPITAL_COMMUNITY): Payer: Self-pay | Admitting: Licensed Clinical Social Worker

## 2015-10-17 ENCOUNTER — Encounter (HOSPITAL_COMMUNITY): Payer: Self-pay

## 2015-10-17 ENCOUNTER — Observation Stay (HOSPITAL_COMMUNITY)
Admission: AD | Admit: 2015-10-17 | Discharge: 2015-10-18 | Disposition: A | Discharge status: Home / Self Care | Source: Intra-hospital | Attending: Psychiatry | Admitting: Psychiatry

## 2015-10-17 DIAGNOSIS — Z79899 Other long term (current) drug therapy: Secondary | ICD-10-CM | POA: Insufficient documentation

## 2015-10-17 DIAGNOSIS — F329 Major depressive disorder, single episode, unspecified: Secondary | ICD-10-CM | POA: Insufficient documentation

## 2015-10-17 DIAGNOSIS — F1014 Alcohol abuse with alcohol-induced mood disorder: Secondary | ICD-10-CM | POA: Insufficient documentation

## 2015-10-17 DIAGNOSIS — F1994 Other psychoactive substance use, unspecified with psychoactive substance-induced mood disorder: Secondary | ICD-10-CM | POA: Diagnosis not present

## 2015-10-17 DIAGNOSIS — F1494 Cocaine use, unspecified with cocaine-induced mood disorder: Secondary | ICD-10-CM | POA: Insufficient documentation

## 2015-10-17 DIAGNOSIS — F1914 Other psychoactive substance abuse with psychoactive substance-induced mood disorder: Principal | ICD-10-CM | POA: Insufficient documentation

## 2015-10-17 DIAGNOSIS — R45851 Suicidal ideations: Secondary | ICD-10-CM | POA: Insufficient documentation

## 2015-10-17 DIAGNOSIS — F17210 Nicotine dependence, cigarettes, uncomplicated: Secondary | ICD-10-CM | POA: Insufficient documentation

## 2015-10-17 MED ORDER — PNEUMOCOCCAL VAC POLYVALENT 25 MCG/0.5ML IJ INJ: 0.5000 mL | INJECTION | INTRAMUSCULAR | Status: DC

## 2015-10-17 MED ORDER — LORAZEPAM 1 MG PO TABS
1.0000 mg | ORAL_TABLET | Freq: Three times a day (TID) | ORAL | Status: DC
  Administered 2015-10-18: 1 mg via ORAL
  Filled 2015-10-17: qty 1

## 2015-10-17 MED ORDER — LORAZEPAM 1 MG PO TABS: 0.0000 mg | ORAL_TABLET | Freq: Four times a day (QID) | ORAL | Status: DC

## 2015-10-17 MED ORDER — ADULT MULTIVITAMIN W/MINERALS CH
1.0000 | ORAL_TABLET | Freq: Every day | ORAL | Status: DC
  Administered 2015-10-17 – 2015-10-18 (×2): 1 via ORAL
  Filled 2015-10-17 (×2): qty 1

## 2015-10-17 MED ORDER — LORAZEPAM 1 MG PO TABS
1.0000 mg | ORAL_TABLET | Freq: Once | ORAL | Status: AC
  Administered 2015-10-17: 1 mg via ORAL
  Filled 2015-10-17: qty 1

## 2015-10-17 MED ORDER — MAGNESIUM HYDROXIDE 400 MG/5ML PO SUSP: 30.0000 mL | Freq: Every day | ORAL | Status: DC | PRN

## 2015-10-17 MED ORDER — LORAZEPAM 1 MG PO TABS: 1.0000 mg | ORAL_TABLET | Freq: Every day | ORAL | Status: DC

## 2015-10-17 MED ORDER — HYDROXYZINE HCL 25 MG PO TABS
25.0000 mg | ORAL_TABLET | Freq: Four times a day (QID) | ORAL | Status: DC | PRN
  Administered 2015-10-17: 25 mg via ORAL
  Filled 2015-10-17: qty 1

## 2015-10-17 MED ORDER — IBUPROFEN 600 MG PO TABS
600.0000 mg | ORAL_TABLET | Freq: Three times a day (TID) | ORAL | Status: DC | PRN
Start: 2015-10-17 — End: 2015-10-18

## 2015-10-17 MED ORDER — FLUOXETINE HCL 20 MG PO CAPS
20.0000 mg | ORAL_CAPSULE | Freq: Every day | ORAL | Status: DC
  Filled 2015-10-17: qty 1

## 2015-10-17 MED ORDER — THIAMINE HCL 100 MG/ML IJ SOLN: 100.0000 mg | Freq: Every day | INTRAMUSCULAR | Status: DC

## 2015-10-17 MED ORDER — LOPERAMIDE HCL 2 MG PO CAPS: 2.0000 mg | ORAL_CAPSULE | ORAL | Status: DC | PRN

## 2015-10-17 MED ORDER — ACETAMINOPHEN 325 MG PO TABS: 650.0000 mg | ORAL_TABLET | Freq: Four times a day (QID) | ORAL | Status: DC | PRN

## 2015-10-17 MED ORDER — VITAMIN B-1 100 MG PO TABS
100.0000 mg | ORAL_TABLET | Freq: Every day | ORAL | Status: DC
  Administered 2015-10-18: 100 mg via ORAL
  Filled 2015-10-17: qty 1

## 2015-10-17 MED ORDER — ONDANSETRON 4 MG PO TBDP: 4.0000 mg | ORAL_TABLET | Freq: Four times a day (QID) | ORAL | Status: DC | PRN

## 2015-10-17 MED ORDER — INFLUENZA VAC SPLIT QUAD 0.5 ML IM SUSY
0.5000 mL | PREFILLED_SYRINGE | INTRAMUSCULAR | Status: DC
  Filled 2015-10-17: qty 0.5

## 2015-10-17 MED ORDER — VITAMIN B-1 100 MG PO TABS: 100.0000 mg | ORAL_TABLET | Freq: Every day | ORAL | Status: DC

## 2015-10-17 MED ORDER — CEFAZOLIN SODIUM-DEXTROSE 2-3 GM-% IV SOLR
INTRAVENOUS | Status: AC
  Filled 2015-10-17: qty 50

## 2015-10-17 MED ORDER — ACETAMINOPHEN 325 MG PO TABS: 650.0000 mg | ORAL_TABLET | ORAL | Status: DC | PRN

## 2015-10-17 MED ORDER — LORAZEPAM 1 MG PO TABS
1.0000 mg | ORAL_TABLET | Freq: Four times a day (QID) | ORAL | Status: AC
  Administered 2015-10-17 (×2): 1 mg via ORAL
  Filled 2015-10-17 (×2): qty 1

## 2015-10-17 MED ORDER — LORAZEPAM 1 MG PO TABS: 0.0000 mg | ORAL_TABLET | Freq: Two times a day (BID) | ORAL | Status: DC

## 2015-10-17 MED ORDER — TRAZODONE HCL 100 MG PO TABS: 100.0000 mg | ORAL_TABLET | Freq: Every evening | ORAL | Status: DC | PRN

## 2015-10-17 MED ORDER — LORAZEPAM 1 MG PO TABS: 1.0000 mg | ORAL_TABLET | Freq: Two times a day (BID) | ORAL | Status: DC

## 2015-10-17 MED ORDER — ALUM & MAG HYDROXIDE-SIMETH 200-200-20 MG/5ML PO SUSP: 30.0000 mL | ORAL | Status: DC | PRN

## 2015-10-17 MED ORDER — TRAZODONE HCL 50 MG PO TABS
50.0000 mg | ORAL_TABLET | Freq: Every evening | ORAL | Status: DC | PRN
  Administered 2015-10-17: 50 mg via ORAL
  Filled 2015-10-17: qty 1

## 2015-10-17 NOTE — Progress Notes (Signed)
CM spoke with [place] who [place].  CM discussed and provided written information for uninsured accepting pcps, discussed the importance of pcp vs EDP services for f/u care, www.needymeds.org, www.goodrx.com, discounted pharmacies and other [place] resources such as [facility] , [facility], affordable care act, financial assistance, uninsured dental services, [place] med assist, DSS and  health department  Reviewed resources for [place] uninsured accepting pcps like [facility], [facility] [address], [facility], [facility], local urgent care centers, [facility], [facility], general medical clinics, family services of the [place], [facility] urgent care plus others, medication resources, [facility] out patient pharmacies and housing Pt voiced understanding and appreciation of resources provided   Provided [facility] contact information Pt agreed to a referral Cm completed referral Pt to be contact by [facility] clinical liason Male at bedside with pt

## 2015-10-17 NOTE — H&P (Signed)
OBS Admission Assessment Adult  Patient Identification: Johnny Harper MRN:  [id number] Date of Evaluation:  10/17/2015 Chief Complaint:  polysubstance abuse Principal Diagnosis: Substance induced mood disorder ([place]) Diagnosis:   Patient Active Problem List   Diagnosis Date Noted  . Substance induced mood disorder [facility]) [F19.94] 10/17/2015    Priority: High   History of Present Illness: Johnny Harper is an 34 y.o. male presenting to [facility] voluntarily. He was accompanied to [facility] by his mother and girlfriend.  He verbalized feelings of worthlessness due to his dependence on drugs.  He admits that he uses daily and helps hinm carry out ADL's.  As earlier reported, he reports using "everything". He uses, "Alcohol, THC, Cocaine, Xanax, and Molly", consistently. His last use of all drugs was yesterday. Patient denies withdrawal symtpoms. He denies history of seizures. He does have a history of black outs. Patient reports suicidal thoughts with a plan to overdose on drugs. Patient reports suicidal thoughts for the past several weeks. He has not history of previous suicide attempts.  Patient states that he hoped to get some help and his family is supportive of him getting help.  Triggers for his current suicidal thoughts include drug use and ,"Feeling like I have a lot to do with little time". He denies HI. He is calm and cooperative. He does have a history of prior legal charges, "disorderly conduct". No current legal issues. No AVH's. Patient was admitted to [facility] 1x in the past at the age of 36 for anger and fighting. He does not have a psychiatrist or therapist.   Associated Signs/Symptoms: Depression Symptoms:  depressed mood, feelings of worthlessness/guilt, difficulty concentrating, hopelessness, loss of energy/fatigue, (Hypo) Manic Symptoms:  Impulsivity, Irritable Mood, Anxiety Symptoms:  Excessive Worry, Psychotic Symptoms:  Na PTSD Symptoms: Na  Total Time spent with  patient: 45 minutes  Past Psychiatric History: Depression  Risk to Self: Is patient at risk for suicide?: No Risk to Others:   Prior Inpatient Therapy:   Prior Outpatient Therapy:    Alcohol Screening: 1. How often do you have a drink containing alcohol?: 4 or more times a week 2. How many drinks containing alcohol do you have on a typical day when you are drinking?: 5 or 6 3. How often do you have six or more drinks on one occasion?: Weekly Preliminary Score: 5 4. How often during the last year have you found that you were not able to stop drinking once you had started?: Daily or almost daily 5. How often during the last year have you failed to do what was normally expected from you becasue of drinking?: Daily or almost daily 6. How often during the last year have you needed a first drink in the morning to get yourself going after a heavy drinking session?: Daily or almost daily 7. How often during the last year have you had a feeling of guilt of remorse after drinking?: Daily or almost daily 8. How often during the last year have you been unable to remember what happened the night before because you had been drinking?: Less than monthly 9. Have you or someone else been injured as a result of your drinking?: Yes, but not in the last year 10. Has a relative or friend or a doctor or another health worker been concerned about your drinking or suggested you cut down?: Yes, during the last year Alcohol Use Disorder Identification Test Final Score (AUDIT): 32 Brief Intervention: AUDIT score less than 7 or less-screening does  not suggest unhealthy drinking-brief intervention not indicated Substance Abuse History in the last 12 months:  Yes.   Consequences of Substance Abuse: crisis management Previous Psychotropic Medications: Yes  Psychological Evaluations: No  Past Medical History: History reviewed. No pertinent past medical history.  Past Surgical History  Procedure Laterality Date  . Root  canal      2014, left upper   Family History:  Family History  Problem Relation Age of Onset  . Hypertension Other   . Diabetes Other    Family Psychiatric  History: Denies Social History:  History  Alcohol Use  . Yes    Comment: Xanax and Molly     History  Drug Use  . Yes  . Special: Cocaine, Marijuana    Social History   Social History  . Marital Status: Single    Spouse Name: N/A  . Number of Children: N/A  . Years of Education: N/A   Social History Main Topics  . Smoking status: Current Some Day Smoker  . Smokeless tobacco: None  . Alcohol Use: Yes     Comment: Xanax and Molly  . Drug Use: Yes    Special: Cocaine, Marijuana  . Sexual Activity: Not Asked   Other Topics Concern  . None   Social History Narrative   Additional Social History:    Allergies:  No Known Allergies Lab Results:  Results for orders placed or performed during the hospital encounter of 10/16/15 (from the past 48 hour(s))  Comprehensive metabolic panel     Status: Abnormal   Collection Time: 10/16/15  3:54 PM  Result Value Ref Range   Sodium 139 135 - 145 mmol/L   Potassium 3.4 (L) 3.5 - 5.1 mmol/L   Chloride 104 101 - 111 mmol/L   CO2 24 22 - 32 mmol/L   Glucose, Bld 139 (H) 65 - 99 mg/dL   BUN 7 6 - 20 mg/dL   Creatinine, Ser 0.97 0.61 - 1.24 mg/dL   Calcium 9.4 8.9 - 10.3 mg/dL   Total Protein 7.8 6.5 - 8.1 g/dL   Albumin 4.4 3.5 - 5.0 g/dL   AST 32 15 - 41 U/L   ALT 20 17 - 63 U/L   Alkaline Phosphatase 54 38 - 126 U/L   Total Bilirubin 0.6 0.3 - 1.2 mg/dL   GFR calc non Af Amer >60 >60 mL/min   GFR calc Af Amer >60 >60 mL/min    Comment: (NOTE) The eGFR has been calculated using the CKD EPI equation. This calculation has not been validated in all clinical situations. eGFR's persistently <60 mL/min signify possible Chronic Kidney Disease.    Anion gap 11 5 - 15  Ethanol (ETOH)     Status: None   Collection Time: 10/16/15  3:54 PM  Result Value Ref Range    Alcohol, Ethyl (B) <5 <5 mg/dL    Comment:        LOWEST DETECTABLE LIMIT FOR SERUM ALCOHOL IS 5 mg/dL FOR MEDICAL PURPOSES ONLY   CBC     Status: Abnormal   Collection Time: 10/16/15  3:54 PM  Result Value Ref Range   WBC 6.2 4.0 - 10.5 K/uL   RBC 4.76 4.22 - 5.81 MIL/uL   Hemoglobin 15.2 13.0 - 17.0 g/dL   HCT 42.0 39.0 - 52.0 %   MCV 88.2 78.0 - 100.0 fL   MCH 31.9 26.0 - 34.0 pg   MCHC 36.2 (H) 30.0 - 36.0 g/dL   RDW 13.1 11.5 - 15.5 %  Platelets 274 150 - 400 K/uL  Urine rapid drug screen (hosp performed) (Not at [facility])     Status: Abnormal   Collection Time: 10/16/15  4:18 PM  Result Value Ref Range   Opiates NONE DETECTED NONE DETECTED   Cocaine POSITIVE (A) NONE DETECTED   Benzodiazepines POSITIVE (A) NONE DETECTED   Amphetamines NONE DETECTED NONE DETECTED   Tetrahydrocannabinol POSITIVE (A) NONE DETECTED   Barbiturates NONE DETECTED NONE DETECTED    Comment:        DRUG SCREEN FOR MEDICAL PURPOSES ONLY.  IF CONFIRMATION IS NEEDED FOR ANY PURPOSE, NOTIFY LAB WITHIN 5 DAYS.        LOWEST DETECTABLE LIMITS FOR URINE DRUG SCREEN Drug Class       Cutoff (ng/mL) Amphetamine      1000 Barbiturate      200 Benzodiazepine   710 Tricyclics       626 Opiates          300 Cocaine          300 THC              50     Metabolic Disorder Labs:  No results found for: HGBA1C, MPG No results found for: PROLACTIN No results found for: CHOL, TRIG, HDL, CHOLHDL, VLDL, LDLCALC  Current Medications: Current Facility-Administered Medications  Medication Dose Route Frequency Provider Last Rate Last Dose  . acetaminophen (TYLENOL) tablet 650 mg  650 mg Oral Q6H PRN Delfin Gant, NP      . alum & mag hydroxide-simeth (MAALOX/MYLANTA) 200-200-20 MG/5ML suspension 30 mL  30 mL Oral Q4H PRN Delfin Gant, NP      . hydrOXYzine (ATARAX/VISTARIL) tablet 25 mg  25 mg Oral Q6H PRN Hampton Abbot, MD      . ibuprofen (ADVIL,MOTRIN) tablet 600 mg  600 mg Oral Q8H PRN  Delfin Gant, NP      . Derrill Memo ON 10/18/2015] Influenza vac split quadrivalent PF (FLUARIX) injection 0.5 mL  0.5 mL Intramuscular Tomorrow-1000 Hampton Abbot, MD      . loperamide (IMODIUM) capsule 2-4 mg  2-4 mg Oral PRN Hampton Abbot, MD      . LORazepam (ATIVAN) tablet 1 mg  1 mg Oral QID Hampton Abbot, MD       Followed by  . [START ON 10/18/2015] LORazepam (ATIVAN) tablet 1 mg  1 mg Oral TID Hampton Abbot, MD       Followed by  . [START ON 10/19/2015] LORazepam (ATIVAN) tablet 1 mg  1 mg Oral BID Hampton Abbot, MD       Followed by  . [START ON 10/20/2015] LORazepam (ATIVAN) tablet 1 mg  1 mg Oral Daily Hampton Abbot, MD      . magnesium hydroxide (MILK OF MAGNESIA) suspension 30 mL  30 mL Oral Daily PRN Delfin Gant, NP      . multivitamin with minerals tablet 1 tablet  1 tablet Oral Daily Hampton Abbot, MD   1 tablet at 10/17/15 1515  . ondansetron (ZOFRAN-ODT) disintegrating tablet 4 mg  4 mg Oral Q6H PRN Hampton Abbot, MD      . Derrill Memo ON 10/18/2015] pneumococcal 23 valent vaccine (PNU-IMMUNE) injection 0.5 mL  0.5 mL Intramuscular Tomorrow-1000 Hampton Abbot, MD      . Derrill Memo ON 10/18/2015] thiamine (VITAMIN B-1) tablet 100 mg  100 mg Oral Daily Hampton Abbot, MD       PTA Medications: No prescriptions prior to admission    Musculoskeletal: Strength &  Muscle Tone: within normal limits Gait & Station: normal Patient leans: N/A  Psychiatric Specialty Exam: Physical Exam  Vitals reviewed.   Review of Systems  All other systems reviewed and are negative.   Blood pressure 135/94, pulse 67, temperature 97.8 F (36.6 C), temperature source Oral, resp. rate 20, height '5\' 8"'  (1.727 m), weight 79.379 kg (175 lb), SpO2 97 %.Body mass index is 26.61 kg/(m^2).  General Appearance: Neat  Eye Contact::  Minimal  Speech:  Clear and Coherent  Volume:  Normal  Mood:  Anxious  Affect:  Appropriate  Thought Process:  Circumstantial and Coherent  Orientation:  Full (Time,  Place, and Person)  Thought Content:  Rumination  Suicidal Thoughts:  No  Homicidal Thoughts:  No  Memory:  Immediate;   Fair Recent;   Fair Remote;   Fair  Judgement:  Fair  Insight:  Fair  Psychomotor Activity:  Normal  Concentration:  Good  Recall:  Good  Fund of Knowledge:Good  Language: Good  Akathisia:  Negative  Handed:  Right  AIMS (if indicated):     Assets:  Desire for Improvement Resilience Social Support  ADL's:  Intact  Cognition: WNL  Sleep: poor   Treatment Plan Summary: Daily contact with patient to assess and evaluate symptoms and progress in treatment, Medication management and monitor in OBS safety  Observation Level/Precautions:  Continuous Observation  Laboratory:  per ED  Psychotherapy:  OBS  Medications:  Per medlist  Consultations:  As needed  Discharge Concerns:  safety  Estimated LOS:  2-7 days  Other:     I certify that OBS services furnished can reasonably be expected to improve the patient's condition.   Freda Munro May Jaeanna Mccomber AGNP-BC 11/15/20164:39 PM

## 2015-10-17 NOTE — BHH Counselor (Signed)
Spoke with patient/ brief counseling on relapse prevention and change of plans/ positive talk.  Also, findings based on pt. Hx reveal benzo. dpendence as evidenced by: Patient report as follows , Pt. States that he  was in [facility] and others at or around the age of 34. In which he was given Prozac and diagnosis of depression. Since age of 34 patient has been self-medicating via street script for Xanax. Patient acknowledged other than current drug use he has had constant problems even when sober with restless leg at night and tremors. Patient also acknowledged problems with mood stabilization and anger episodes mood wise.  Consulted with Karleen HampshireSpencer, PA briefly regarding pt. treatment and findings. Karleen HampshireSpencer, PA agreed that there is potential benzo dependence, and co-occuring disorder treatment would be beneficial for pt. pencer, PA recommended also that this writer put in findings and notes for staff to review in a.m. And to help with pt treatment planning.  Patient has positive motivation for change which he identified as a child on the way and another daughter. Positive change possibilities were discussed with patient and patient agrees that he does have financial means that are legal and legitimate to pursue help for self and enjoy time with children while being sober and working on sobriety. Patient discussed with this Clinical research associatewriter and agrees that he would seek sponsorship, and help for sobriety, and is agreeable to follow a potential treatment plan with medical professionals to address and work on stabilization and overcoming/ possibly treating underlying mental health issues that are involved with years of self-medicating followed by substance abuse. It is this writers opinion that patient  would benefit from initial treatment for stabilization and medication management involving co-occuring disorders followed by follow ups with primary care, therapist, and drug/ alcohol treatment. Johnny Harper,  LCAS-A, LPC-A, [facility]  Counselor 10/17/2015 9:35 PM  Lenna Hagarty K. Sherlon Harper, LCAS-A, LPC-A, [facility]  Counselor 10/17/2015 9:29 PM

## 2015-10-17 NOTE — BH Assessment (Signed)
BHH Assessment Progress Note  Per Johnny MinsMojeed Akintayo, MD, this [facility].  Johnny CoyerEric Kaplan, RN, [facility] has assigned pt to Rm Obs 2.  Pt has signed Voluntary Admission and Consent for Treatment, as well as Consent to Release Information, and signed [facility]  Pt's nurse, Johnny Harper, has been notified, and agrees to send original paperwork along with pt via Johnny Harper, and to call report to [phone] or [phone].  Johnny Canninghomas Jakobe Blau, MA Triage Specialist [phone]

## 2015-10-17 NOTE — Progress Notes (Signed)
D: Patient awake in in bed on first approach.  Patient states he is doing ok.  Patient states he is here for substance abuse.  Patient states he does appear to be having some withdrawal symptoms.  Patient states, "I feels restless and antsy."  Patient states his goal is to get in to a long tern treatment facility.  Patient denies SI/HI and denies AVH.  Snacks offered to patient tonight as well. A: Staff to monitor Q 15 mins for safety.  Encouragement and support offered.  Scheduled medications administered per orders. R: Patient remains safe on the unit.

## 2015-10-17 NOTE — Progress Notes (Signed)
Nursing Admission Note:  Patient is well developed, 34 yo [facility] diagnosis of Detox from Alcohol, Cocaine, and Xanax, which patient admits to doing nearly every day.  Patient is well spoken, polite, states he is having a new baby in April in April and " i need to get off of this stuff for real".Patient states he is employed but his habit of drkinking and snorting cocaine is "getting to be too much". Patient is in reasonably upbeat mood and denies any SI/HI/ AVH at this time but does state he was having SI when he first presented to the ED on the previous day. Search completed, no contrand found, patient has no belongings to check, just a couple of toiletries given to him in the ED.  Patient introduced to Unit and is settled into his bed. Voices no c/o's at this time.

## 2015-10-17 NOTE — ED Notes (Signed)
Pt discharged ambulatory with [place] driver.  Pt had no belongings with him.  He was in no distress at discharge.

## 2015-10-17 NOTE — BH Assessment (Signed)
Assessment Note  Johnny Harper is an 34 y.o. male presenting to [facility] voluntarily. He was transported to [facility] by his mother and girlfriend. He is here due to his drug use and feelings of worthlessness. Patient reports using "everything". He uses, "Alcohol, THC, Cocaine, Xanax, and Molly", consistently. His last use of all drugs is yesterday. Patient denies withdrawal symtpoms. He denies history of seizures. He does have a history of  black outs. Patient reports suicidal thoughts with a plan to overdose on drugs. Patient reports suicidal thoughts for the past several weeks. He has not history of previous suicide attempts. Triggers for his current suicidal thoughts include drug use and ,"Feeling like I have a lot to do with little time". He denies HI. He is calm and cooperative. He does have a history of prior legal charges, "disorderly conduct". No current legal issues. No AVH's. Patient was admitted to [facility] 1x in the past at the age of 38 for anger and fighting. He does not have a psychiatrist or therapist.   Diagnosis: Polysubstance, Depressive Disorder NOS, and Substance Induced Mood Disorder  Past Medical History: History reviewed. No pertinent past medical history.  Past Surgical History  Procedure Laterality Date  . Root canal      2014, left upper    Family History:  Family History  Problem Relation Age of Onset  . Hypertension Other   . Diabetes Other     Social History:  reports that he has been smoking.  He does not have any smokeless tobacco history on file. He reports that he drinks alcohol. He reports that he uses illicit drugs (Cocaine and Marijuana).  Additional Social History:  Alcohol / Drug Use Pain Medications: SEE MAR Prescriptions: SEE MAR Over the Counter: SEE MAR History of alcohol / drug use?: Yes Substance #1 Name of Substance 1: Alcohol  1 - Age of First Use: 34 yrs old  1 - Amount (size/oz): 5th of liquor and 12 pack of beer 1 - Frequency: daily  1 -  Duration: on-going  1 - Last Use / Amount: "Yesterday morning" Substance #2 Name of Substance 2: THC 2 - Age of First Use: 34 yrs old  2 - Amount (size/oz): "Tons' 2 - Frequency: daily  2 - Duration: on-going  2 - Last Use / Amount: yesterday Substance #3 Name of Substance 3: Cocaine  3 - Age of First Use: 34 yrs old  3 - Amount (size/oz): "gram or more" 3 - Frequency: daly  3 - Duration: 2 weeks 3 - Last Use / Amount: yesterday  Substance #4 Name of Substance 4: Molly  4 - Age of First Use: 34 yrs old  4 - Amount (size/oz): "Not that much" 4 - Frequency: daily  4 - Duration: on-going  4 - Last Use / Amount: yesterday   CIWA: CIWA-Ar BP: 128/80 mmHg Pulse Rate: 89 Nausea and Vomiting: no nausea and no vomiting Tactile Disturbances: none Tremor: no tremor Auditory Disturbances: not present Paroxysmal Sweats: no sweat visible Visual Disturbances: not present Anxiety: no anxiety, at ease Headache, Fullness in Head: none present Agitation: normal activity Orientation and Clouding of Sensorium: oriented and can do serial additions CIWA-Ar Total: 0 COWS:    Allergies: No Known Allergies  Home Medications:  (Not in a hospital admission)  OB/GYN Status:  No LMP for male patient.  General Assessment Data Location of Assessment: [facility] ED Is this a Tele or Face-to-Face Assessment?: Face-to-Face Is this an Initial Assessment or a Re-assessment for  this encounter?: Initial Assessment Marital status: Single Maiden name:  (n/a) Is patient pregnant?: No Pregnancy Status: No Living Arrangements: Spouse/significant other, Parent ("I live with my mother and girl friend") Can pt return to current living arrangement?: No Admission Status: Voluntary Is patient capable of signing voluntary admission?: Yes Referral Source: Self/Family/Friend Insurance type:  (Self Pay)     Crisis Care Plan Living Arrangements: Spouse/significant other, Parent ("I live with my mother and girl  friend") Name of Psychiatrist:  (No psychiatrist ) Name of Therapist:  (No therapist )  Education Status Is patient currently in school?: No Current Grade:  (n/a) Highest grade of school patient has completed:  (n/a) Name of school:  (n/a) Contact person:  (n/a)  Risk to self with the past 6 months Suicidal Ideation: Yes-Currently Present Has patient been a risk to self within the past 6 months prior to admission? : Yes Suicidal Intent: Yes-Currently Present Has patient had any suicidal intent within the past 6 months prior to admission? : Yes Is patient at risk for suicide?: Yes Suicidal Plan?: Yes-Currently Present (overdose on drugs) Has patient had any suicidal plan within the past 6 months prior to admission? : Yes Specify Current Suicidal Plan:  (overdose on drugs) Access to Means: Yes Specify Access to Suicidal Means:  (drugs ) What has been your use of drugs/alcohol within the last 12 months?:  ( polysubstance use ) Previous Attempts/Gestures: No How many times?:  (n/a) Other Self Harm Risks:  (n/a) Triggers for Past Attempts: Other (Comment) Intentional Self Injurious Behavior: None Family Suicide History: No Recent stressful life event(s): Other (Comment) (" t) Persecutory voices/beliefs?: No Depression: Yes Depression Symptoms: Feeling angry/irritable, Feeling worthless/self pity, Loss of interest in usual pleasures, Guilt, Fatigue, Isolating, Tearfulness, Insomnia, Despondent Substance abuse history and/or treatment for substance abuse?: No Suicide prevention information given to non-admitted patients: Not applicable  Risk to Others within the past 6 months Homicidal Ideation: No Does patient have any lifetime risk of violence toward others beyond the six months prior to admission? : No Thoughts of Harm to Others: No Current Homicidal Intent: No Current Homicidal Plan: No Access to Homicidal Means: No Identified Victim:  (n/a) History of harm to others?:  No Assessment of Violence: None Noted Violent Behavior Description:  (patient is calm and cooperative ) Does patient have access to weapons?: No Criminal Charges Pending?: No Does patient have a court date: No (No current court date bue has hx of legal issues ) Is patient on probation?: No (past history of disorderly conduct)  Psychosis Hallucinations: None noted Delusions: None noted  Mental Status Report Appearance/Hygiene: Disheveled Eye Contact: Good Motor Activity: Freedom of movement Speech: Logical/coherent Level of Consciousness: Alert Mood: Depressed Affect: Appropriate to circumstance Anxiety Level: None Judgement: Impaired Orientation: Place, Time, Situation, Person Obsessive Compulsive Thoughts/Behaviors: None  Cognitive Functioning Concentration: Decreased Memory: Remote Intact, Recent Intact IQ: Average Insight: Fair Impulse Control: Fair Appetite: Good ("When I do drugs I don't eat") Weight Loss:  (none reported) Weight Gain:  (none reported) Sleep: Decreased Vegetative Symptoms: None  ADLScreening [facility] Assessment Services) Patient's cognitive ability adequate to safely complete daily activities?: Yes Patient able to express need for assistance with ADLs?: Yes Independently performs ADLs?: Yes (appropriate for developmental age)  Prior Inpatient Therapy Prior Inpatient Therapy: Yes Prior Therapy Dates:  ("I was [age] or 34 yrs old") Prior Therapy Facilty/Provider(s):  [facility]) Reason for Treatment:  (Anger management and fights)  Prior Outpatient Therapy Prior Outpatient Therapy: No Prior Therapy Dates:  (  n/a) Prior Therapy Facilty/Provider(s):  (n/a) Reason for Treatment:  (n/a) Does patient have an ACCT team?: No Does patient have Intensive In-House Services?  : No Does patient have [facility] services? : No Does patient have P4CC services?: No  ADL Screening (condition at time of admission) Patient's cognitive ability adequate to safely complete  daily activities?: Yes Is the patient deaf or have difficulty hearing?: No Does the patient have difficulty seeing, even when wearing glasses/contacts?: No Does the patient have difficulty concentrating, remembering, or making decisions?: No Patient able to express need for assistance with ADLs?: Yes Does the patient have difficulty dressing or bathing?: No Independently performs ADLs?: Yes (appropriate for developmental age) Does the patient have difficulty walking or climbing stairs?: No Weakness of Legs: None Weakness of Arms/Hands: None  Home Assistive Devices/Equipment Home Assistive Devices/Equipment: None    Abuse/Neglect Assessment (Assessment to be complete while patient is alone) Physical Abuse: Denies Verbal Abuse: Denies Sexual Abuse: Denies Exploitation of patient/patient's resources: Denies Self-Neglect: Denies Values / Beliefs Cultural Requests During Hospitalization: None Spiritual Requests During Hospitalization: None   Advance Directives (For Healthcare) Does patient have an advance directive?: No    Additional Information 1:1 In Past 12 Months?: No CIRT Risk: No Elopement Risk: No Does patient have medical clearance?: Yes     Disposition:  Disposition Initial Assessment Completed for this Encounter: Yes (Observation unit chair  # 2) Disposition of Patient: Referred to, Other dispositions (Pt. meets criteria for [facility] observation unit, per Dr. Lucianne Muss)  On Site Evaluation by:   Reviewed with Physician:    Melynda Ripple [facility] 10/17/2015 9:45 AM

## 2015-10-18 DIAGNOSIS — F1994 Other psychoactive substance use, unspecified with psychoactive substance-induced mood disorder: Secondary | ICD-10-CM | POA: Diagnosis not present

## 2015-10-18 MED ORDER — TRAZODONE HCL 50 MG PO TABS: 50.0000 mg | ORAL_TABLET | Freq: Every evening | ORAL | Status: DC | PRN

## 2015-10-18 NOTE — Progress Notes (Signed)
D: Patient resting in bed with eyes closed.  Respirations even and unlabored.  Patient appears to be in no apparent distress. A: Staff to monitor Q 15 mins for safety.   R:Patient remains safe on the unit.  

## 2015-10-18 NOTE — Discharge Instructions (Signed)
Patient will follow up with Alcohol and Drug services.

## 2015-10-18 NOTE — Progress Notes (Signed)
Patient discharged per MD orders. Patient given education regarding follow-up appointments and medications. Patient denies any questions or concerns about these instructions. Patient was escorted to locker and given belongings before discharge to hospital lobby. Patient currently denies SI/HI and auditory and visual hallucinations on discharge. 

## 2015-10-18 NOTE — Discharge Summary (Signed)
Physician Discharge Summary Note  Patient:  Johnny Harper is an 34 y.o., male MRN:  [id number] DOB:  Mar 18, 1981 Patient phone:  [phone] (home)  Patient address:   [address] [place] [address],  Total Time spent with patient: 45 minutes  Date of Admission:  10/17/2015 Date of Discharge: 10/18/2015  Reason for Admission:  Substance abuse  Principal Problem: Substance induced mood disorder [facility]) Discharge Diagnoses: Patient Active Problem List   Diagnosis Date Noted  . Substance induced mood disorder [facility]) [F19.94] 10/17/2015    Priority: High    Past Psychiatric History: see above  Past Medical History: History reviewed. No pertinent past medical history.  Past Surgical History  Procedure Laterality Date  . Root canal      2014, left upper   Family History:  Family History  Problem Relation Age of Onset  . Hypertension Other   . Diabetes Other    Family Psychiatric  History: Denies Social History:  History  Alcohol Use  . Yes    Comment: Xanax and Molly     History  Drug Use  . Yes  . Special: Cocaine, Marijuana    Social History   Social History  . Marital Status: Single    Spouse Name: N/A  . Number of Children: N/A  . Years of Education: N/A   Social History Main Topics  . Smoking status: Current Some Day Smoker  . Smokeless tobacco: None  . Alcohol Use: Yes     Comment: Xanax and Molly  . Drug Use: Yes    Special: Cocaine, Marijuana  . Sexual Activity: Not Asked   Other Topics Concern  . None   Social History Narrative    Hospital Course:  Johnny Harper was admitted to OBS unit for Substance induced mood disorder (HCC) and crisis management.  He was treated discharged with the medications listed below under Medication List.  Medical problems were identified and treated as needed.  Home medications were restarted as appropriate.  Improvement was monitored by observation and Johnny Harper daily report of symptom  reduction.  Safety and mental status was monitored during stay.      Johnny Harper was evaluated by the treatment team for stability and plans for continued recovery upon discharge.  Johnny Harper motivation was an integral factor for scheduling further treatment.  Employment, transportation, bed availability, health status, family support, and any pending legal issues were also considered during his hospital stay.  He was offered further treatment options upon discharge including but not limited to Residential, Intensive Outpatient, and Outpatient treatment.  Johnny Harper will follow up with the services as listed below under Follow Up Information.     Upon completion of this admission the patient was both mentally and medically stable for discharge denying suicidal/homicidal ideation, auditory/visual/tactile hallucinations, delusional thoughts and paranoia.      Physical Findings: AIMS: Facial and Oral Movements Muscles of Facial Expression: None, normal Lips and Perioral Area: None, normal Jaw: None, normal Tongue: None, normal,Extremity Movements Upper (arms, wrists, hands, fingers): None, normal Lower (legs, knees, ankles, toes): None, normal, Trunk Movements Neck, shoulders, hips: None, normal, Overall Severity Severity of abnormal movements (highest score from questions above): None, normal Incapacitation due to abnormal movements: None, normal Patient's awareness of abnormal movements (rate only patient's report): No Awareness, Dental Status Current problems with teeth and/or dentures?: No Does patient usually wear dentures?: No  CIWA:  CIWA-Ar Total: 2 COWS:  COWS Total Score: 5  Musculoskeletal: Strength & Muscle Tone: within normal limits Gait & Station: normal Patient leans: N/A  Psychiatric Specialty Exam: Review of Systems  All other systems reviewed and are negative.   Blood pressure 115/82, pulse 67, temperature 97.8 F (36.6 C), temperature source  Oral, resp. rate 18, height  (1.727 m), weight 79.379 kg (175 lb), SpO2 100 %.Body mass index is 26.61 kg/(m^2).   General Appearance: Neat  Eye Contact:: Minimal  Speech: Clear and Coherent  Volume: Normal  Mood: Anxious  Affect: Appropriate  Thought Process: Circumstantial and Coherent  Orientation: Full (Time, Place, and Person)  Thought Content: Rumination  Suicidal Thoughts: No  Homicidal Thoughts: No  Memory: Immediate; Fair Recent; Fair Remote; Fair  Judgement: Fair  Insight: Fair  Psychomotor Activity: Normal  Concentration: Good  Recall: Good  Fund of Knowledge:Good  Language: Good  Akathisia: Negative  Handed: Right  AIMS (if indicated):    Assets: Desire for Improvement Resilience Social Support  ADL's: Intact  Cognition: WNL  Sleep: poor       Have you used any form of tobacco in the last 30 days? (Cigarettes, Smokeless Tobacco, Cigars, and/or Pipes): No  Has this patient used any form of tobacco in the last 30 days? (Cigarettes, Smokeless Tobacco, Cigars, and/or Pipes) Yes, N/A  Metabolic Disorder Labs:  No results found for: HGBA1C, MPG No results found for: PROLACTIN No results found for: CHOL, TRIG, HDL, CHOLHDL, VLDL, LDLCALC    Discharge destination:  Home  Is patient on multiple antipsychotic therapies at discharge:  No   Has Patient had three or more failed trials of antipsychotic monotherapy by history:  No  Recommended Plan for Multiple Antipsychotic Therapies: NA     Medication List    TAKE these medications      Indication   traZODone 50 MG tablet  Commonly known as:  DESYREL  Take 1 tablet (50 mg total) by mouth at bedtime and may repeat dose one time if needed.   Indication:  Trouble Sleeping           Follow-up Information    Follow up with Alcohol and Drug services Today.   Why:  SAIOP and medication management   Contact information:   [phone] [address] [place], [place]      Follow-up recommendations:  Activity:  as tol Diet:  as tol  Comments:  1.  Take all your medications as prescribed.              2.  Report any adverse side effects to outpatient provider.  Resources given to patient to go to [facility] for ongoing outpatient treatment for mood and substance abuse.                         3.  Patient instructed to not use alcohol or illegal drugs while on prescription medicines.            4.  In the event of worsening symptoms, instructed patient to call 911, the crisis hotline or go to nearest emergency room for evaluation of symptoms.  Signed: Velna Hatchet May Agustin AGNP-BC 10/18/2015, 1:59 PM   Patient seen, evaluated by me and treatment plan formulated by me. Patient struggles with addiction and depression, is being referred to [facility] for substance abuse and depression follow up. Patient is being discharged on trazadone to help with sleep. The risks and benefits of Trazadone including Priaprism was discussed with patient . Patient denied  any suicidal or homical ideation or psychosis

## 2016-11-03 IMAGING — CR DG CHEST 1V PORT
1 series · 1 of 1 positions shown · non-contrast
Comparison: Chest radiograph from 04/20/2014

CLINICAL DATA: Acute onset of central chest pain, radiating to the
right arm. Initial encounter.

EXAM:
PORTABLE CHEST 1 VIEW

[AP]
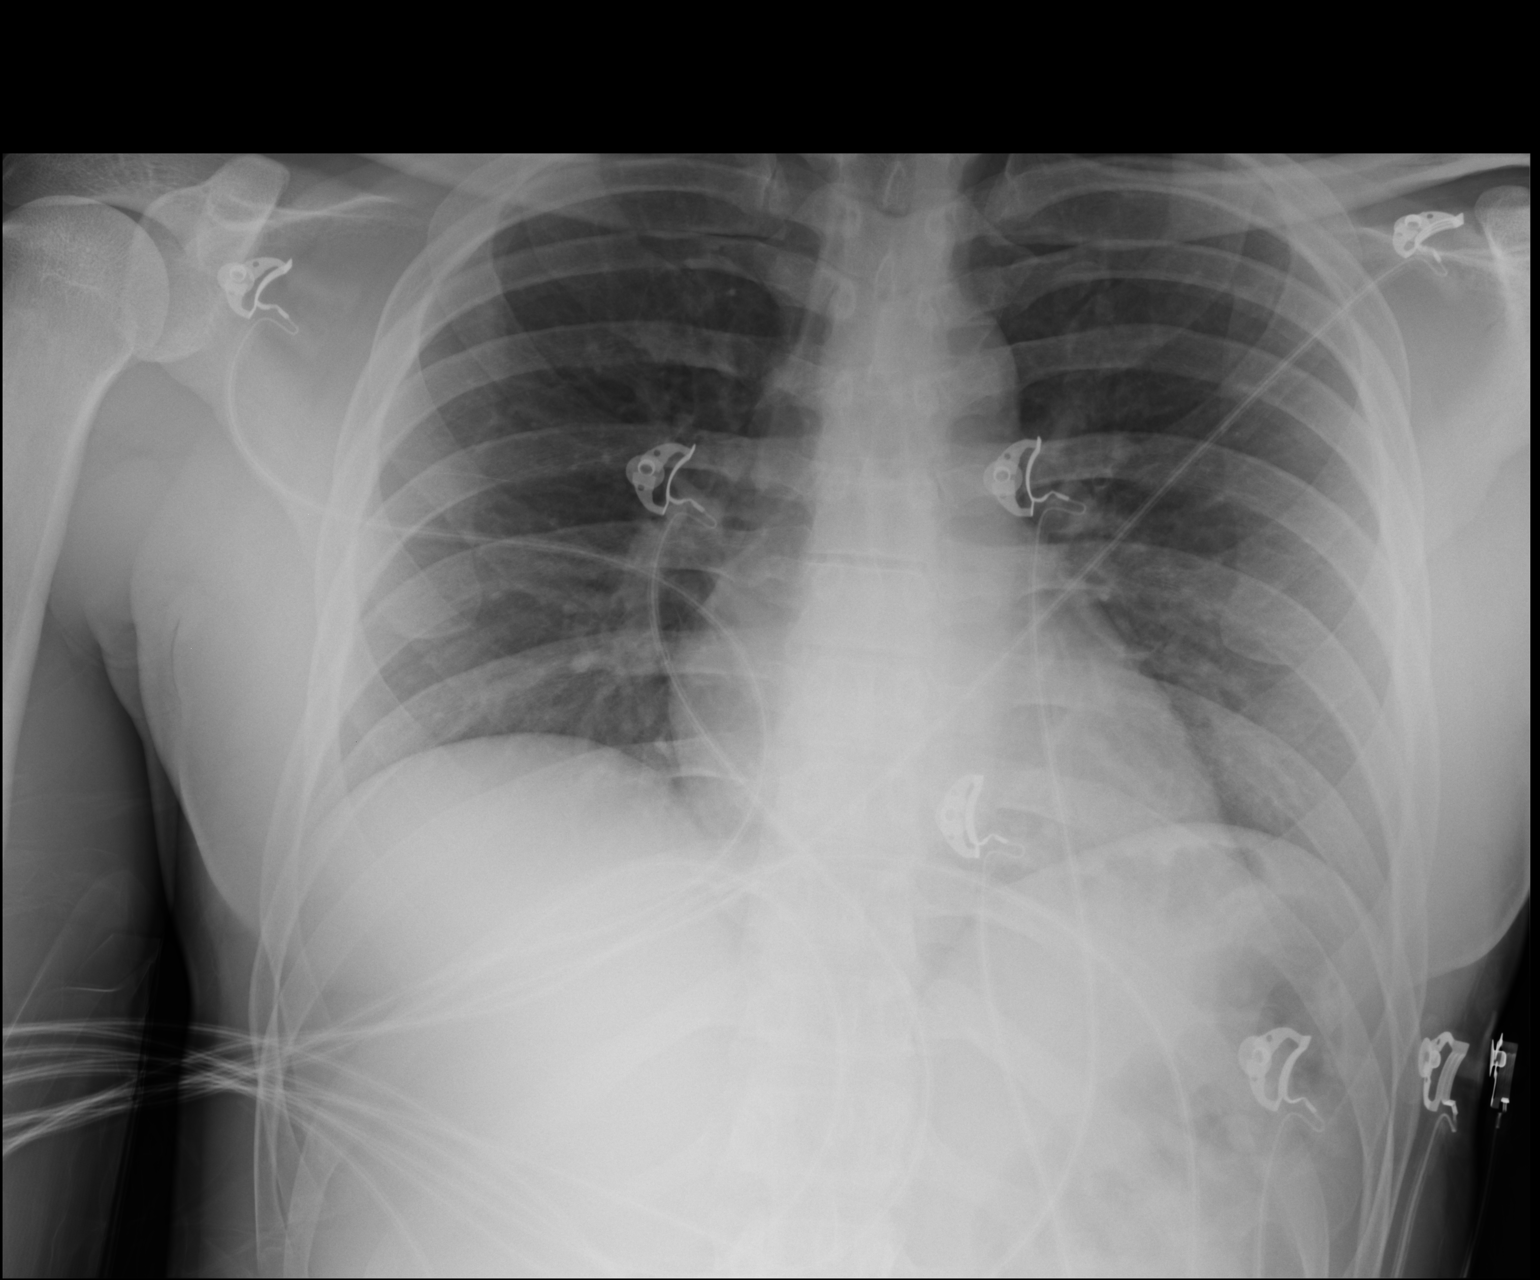

[1 of 1 positions shown; findings below may reference images not displayed]

FINDINGS: The lungs are well-aerated and clear. There is no evidence of focal
opacification, pleural effusion or pneumothorax.

The cardiomediastinal silhouette is within normal limits. No acute
osseous abnormalities are seen.
IMPRESSION: No acute cardiopulmonary process seen.

## 2016-12-12 IMAGING — CR DG CHEST 2V
2 series · 2 of 2 positions shown · non-contrast
Comparison: 09/07/2015

CLINICAL DATA: Pt complains of productive cough x 2-3 weeks.
Smoker. Shielded pt.

EXAM:
CHEST - 2 VIEW

[w chest pa]
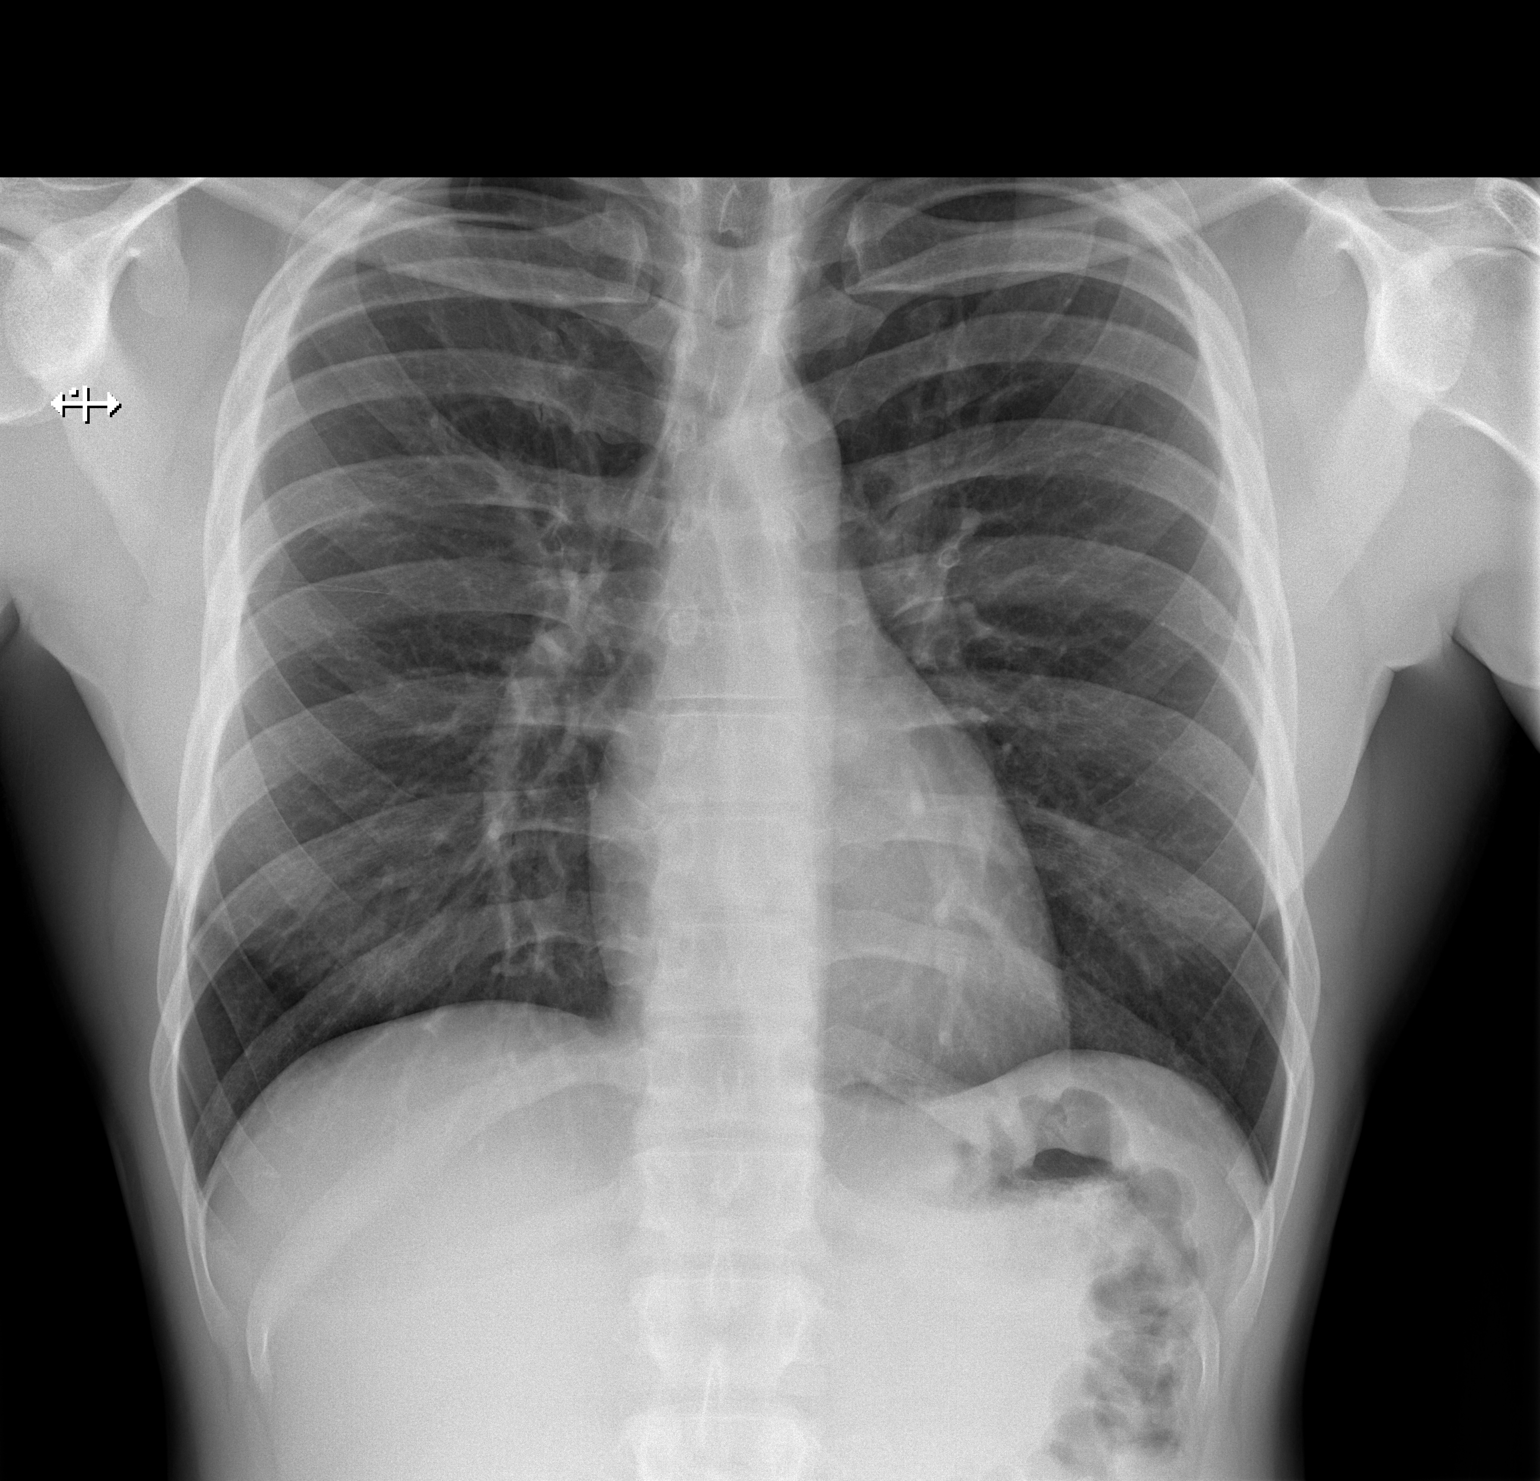

[w chest lat]
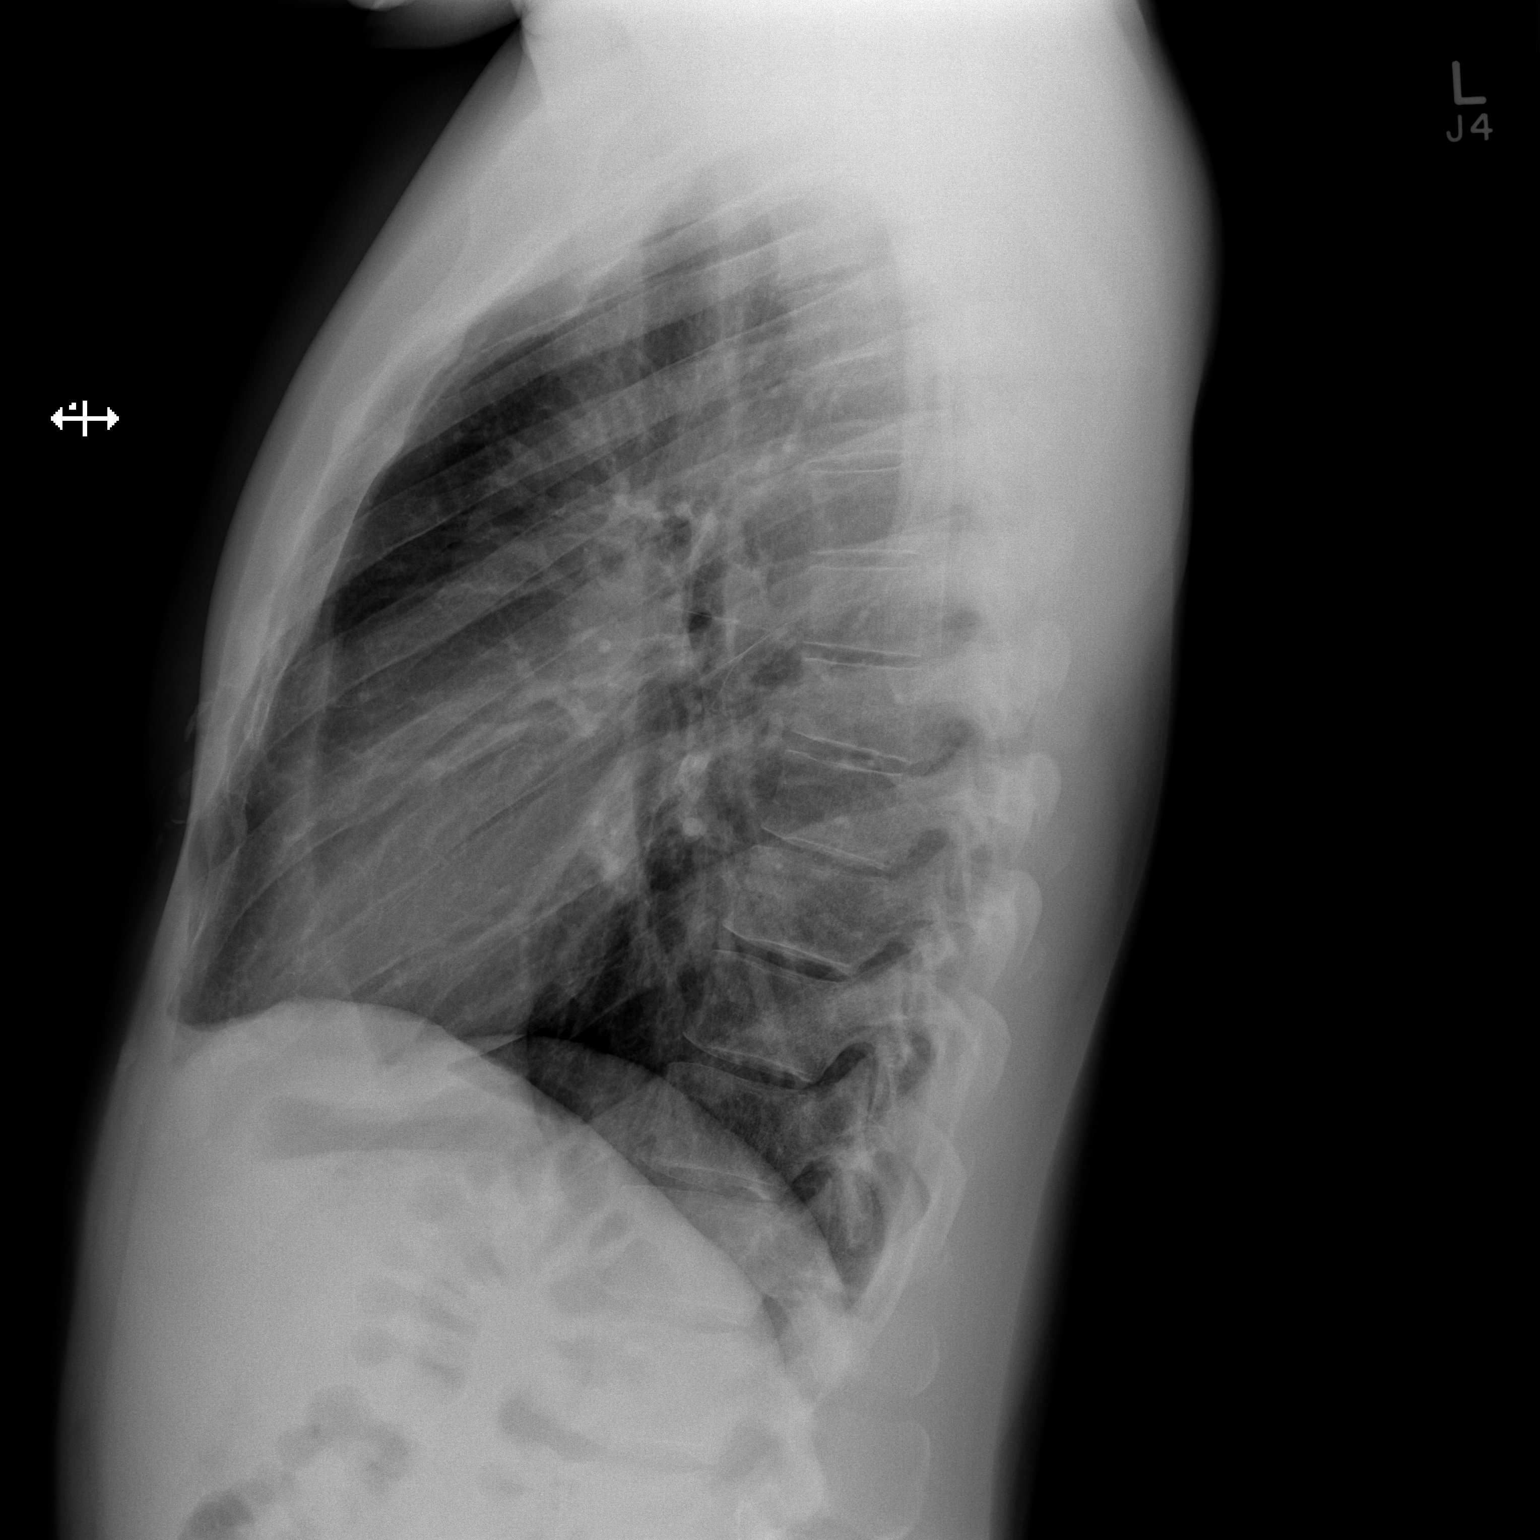

[2 of 2 positions shown; findings below may reference images not displayed]

FINDINGS: Lungs are clear. Heart size and mediastinal contours are within
normal limits.
No effusion.
Visualized skeletal structures are unremarkable.
IMPRESSION: No acute cardiopulmonary disease.

## 2017-07-01 ENCOUNTER — Emergency Department (HOSPITAL_COMMUNITY)
Admission: EM | Admit: 2017-07-01 | Discharge: 2017-07-01 | Disposition: A | Discharge status: Home / Self Care | Payer: Self-pay | Attending: Emergency Medicine | Admitting: Emergency Medicine

## 2017-07-01 ENCOUNTER — Encounter (HOSPITAL_COMMUNITY): Payer: Self-pay | Admitting: Emergency Medicine

## 2017-07-01 DIAGNOSIS — W274XXA Contact with kitchen utensil, initial encounter: Secondary | ICD-10-CM | POA: Insufficient documentation

## 2017-07-01 DIAGNOSIS — Y929 Unspecified place or not applicable: Secondary | ICD-10-CM | POA: Insufficient documentation

## 2017-07-01 DIAGNOSIS — Y939 Activity, unspecified: Secondary | ICD-10-CM | POA: Insufficient documentation

## 2017-07-01 DIAGNOSIS — S61011A Laceration without foreign body of right thumb without damage to nail, initial encounter: Secondary | ICD-10-CM | POA: Insufficient documentation

## 2017-07-01 DIAGNOSIS — Y998 Other external cause status: Secondary | ICD-10-CM | POA: Insufficient documentation

## 2017-07-01 MED ORDER — LIDOCAINE HCL (PF) 1 % IJ SOLN
30.0000 mL | Freq: Once | INTRAMUSCULAR | Status: AC
Start: 2017-07-01 — End: 2017-07-01
  Administered 2017-07-01: 5 mL
  Filled 2017-07-01: qty 30

## 2017-07-01 NOTE — ED Provider Notes (Signed)
[facility]-EMERGENCY DEPT Provider Note   CSN: [id number] Arrival date & time: 07/01/17  [id number]  By signing my name below, I, [name], attest that this documentation has been prepared under the direction and in the presence of non-physician practitioner, Lupe Bonner, PA-C. Electronically Signed: [name], ED Scribe. 07/01/17. 9:35 AM.  History   Chief Complaint Chief Complaint  Patient presents with  . Finger Injury   The history is provided by the patient. No language interpreter was used.   HPI Comments: Johnny CampanileWilliam S Harper is a 36 y.o. male who presents to the Emergency Department complaining of laceration to the right thumb onset just PTA. Pt was using a can opener and cut his thumb on a piece that broke. No broken pieces were in the laceration. Tetanus is UTD. Pt reports associated numbness in his thumb. He is able to move the thumb with minimal pain. No other complaints at this time.  History reviewed. No pertinent past medical history.  Patient Active Problem List   Diagnosis Date Noted  . Substance induced mood disorder (HCC) 10/17/2015    Past Surgical History:  Procedure Laterality Date  . ROOT CANAL     2014, left upper       Home Medications    Prior to Admission medications   Medication Sig Start Date End Date Taking? Authorizing Provider  traZODone (DESYREL) 50 MG tablet Take 1 tablet (50 mg total) by mouth at bedtime and may repeat dose one time if needed. 10/18/15   Adonis BrookAgustin, Sheila, NP    Family History Family History  Problem Relation Age of Onset  . Hypertension Other   . Diabetes Other     Social History Social History  Substance Use Topics  . Smoking status: Never Smoker  . Smokeless tobacco: Never Used  . Alcohol use Yes     Comment: Xanax and Molly     Allergies   Patient has no known allergies.   Review of Systems Review of Systems  Musculoskeletal: Positive for myalgias.  Skin: Positive for wound.  Neurological:  Positive for numbness.     Physical Exam Updated Vital Signs BP (!) 136/94 (BP Location: Left Arm)   Pulse 60   Temp 97.8 F (36.6 C) (Oral)   Resp 17   Ht 5\' 9"  (1.753 m)   Wt 81.6 kg (180 lb)   SpO2 100%   BMI 26.58 kg/m   Physical Exam  Constitutional: He appears well-developed and well-nourished. No distress.  HENT:  Head: Normocephalic and atraumatic.  Mouth/Throat: Oropharynx is clear and moist. No oropharyngeal exudate.  Eyes: Pupils are equal, round, and reactive to light. Conjunctivae are normal. Right eye exhibits no discharge. Left eye exhibits no discharge. No scleral icterus.  Neck: Normal range of motion. Neck supple. No thyromegaly present.  Cardiovascular: Normal rate, regular rhythm, normal heart sounds and intact distal pulses.  Exam reveals no gallop and no friction rub.   No murmur heard. Pulmonary/Chest: Effort normal and breath sounds normal. No stridor. No respiratory distress. He has no wheezes. He has no rales.  Musculoskeletal: He exhibits no edema.  Lymphadenopathy:    He has no cervical adenopathy.  Neurological: He is alert. Coordination normal.  Skin: Skin is warm and dry. No rash noted. He is not diaphoretic. No pallor.  3 cm laceration on the distal palmer aspect of right thumb. Paresthesia around the wound but sensation is intact. Full ROM at the IP joint. Radial pulse is intact. Bleeding is controlled.  Psychiatric: He has a normal mood and affect.  Nursing note and vitals reviewed.    ED Treatments / Results  DIAGNOSTIC STUDIES: Oxygen Saturation is 100% on RA, normal by my interpretation.   COORDINATION OF CARE: 9:17 AM-Discussed next steps with pt including repair. Pt verbalized understanding and is agreeable with the plan.   Labs (all labs ordered are listed, but only abnormal results are displayed) Labs Reviewed - No data to display  EKG  EKG Interpretation None       Radiology No results  found.  Procedures Procedures (including critical care time)  Medications Ordered in ED Medications  lidocaine (PF) (XYLOCAINE) 1 % injection 30 mL (5 mLs Infiltration Given 07/01/17 1022)     Initial Impression / Assessment and Plan / ED Course  I have reviewed the triage vital signs and the nursing notes.  Pertinent labs & imaging results that were available during my care of the patient were reviewed by me and considered in my medical decision making (see chart for details).     Tetanus UTD. Laceration occurred < 12 hours prior to repair. Laceration repaired by resident physician, Noralee CharsAsiyah Mikell, MD. See her procedure note for more detail. Discussed laceration care with pt and answered questions. Pt to f-u for suture removal in 5-7 days and wound check sooner should there be signs of dehiscence or infection. Pt is hemodynamically stable with no complaints prior to dc.     Final Clinical Impressions(s) / ED Diagnoses   Final diagnoses:  Laceration of right thumb without foreign body without damage to nail, initial encounter    New Prescriptions New Prescriptions   No medications on file   I personally performed the services described in this documentation, which was scribed in my presence. The recorded information has been reviewed and is accurate.     [name], [name], PA-C 07/01/17 1023    Raeford RazorKohut, Stephen, MD 07/06/17 [phone]

## 2017-07-01 NOTE — ED Provider Notes (Signed)
LACERATION REPAIR Performed by: Danella MaiersAsiyah Z Calianna Kim Authorized by: Danella MaiersAsiyah Z Fidencia Mccloud Consent: Verbal consent obtained. Risks and benefits: risks, benefits and alternatives were discussed Consent given by: patient Patient identity confirmed: provided demographic data Prepped and Draped in normal sterile fashion Wound explored  Laceration Location: right thumb  Laceration Length:1.5 cm  No Foreign Bodies seen or palpated  Anesthesia: local infiltration  Local anesthetic: via thumb nerve block lidocaine 1 %  Anesthetic total: 5 ml  Irrigation method: syringe Amount of cleaning: standard  Skin closure: simple interrupted with  5 prolene   Number of sutures: 8  Technique: Simple interrupted   Patient tolerance: Patient tolerated the procedure well with no immediate complications.   Berton BonMikell, Araly Kaas Zahra, MD 07/01/17 1013    Raeford RazorKohut, Stephen, MD 07/06/17 [phone]

## 2017-07-01 NOTE — ED Notes (Signed)
Reviewed wound dressing with pt. He verbalized understanding.  Reviewed s/s of infection.  Pt. Verbalized understanding.  These instructions are also on pt.s discharge papers

## 2017-07-01 NOTE — Discharge Instructions (Signed)
Treatment: Keep your wound dry and dressing applied until this time tomorrow. After 24 hours, you may wash with warm soapy water. Dry and apply antibiotic ointment and clean dressing. Do this daily until your sutures are removed.  Follow-up: Please return to emergency department in 5-7 days for suture removal. Be aware of signs of infection: fever, increasing pain, redness, swelling, drainage from the area. Please return to emergency department if you develop any of these symptoms or if any of the sutures come out prior to removal. Please return to the emergency department if you develop any other new or worsening symptoms.

## 2017-07-01 NOTE — ED Triage Notes (Signed)
Pt. Stated, I cut my rt. Thumb on a can opener

## 2019-12-02 ENCOUNTER — Other Ambulatory Visit: Payer: Self-pay

## 2019-12-02 ENCOUNTER — Encounter (HOSPITAL_COMMUNITY): Payer: Self-pay | Admitting: Emergency Medicine

## 2019-12-02 ENCOUNTER — Emergency Department (HOSPITAL_COMMUNITY)
Admission: EM | Admit: 2019-12-02 | Discharge: 2019-12-03 | Discharge status: Against Medical Advice | Payer: Self-pay | Attending: Emergency Medicine | Admitting: Emergency Medicine

## 2019-12-02 DIAGNOSIS — Z5321 Procedure and treatment not carried out due to patient leaving prior to being seen by health care provider: Secondary | ICD-10-CM | POA: Insufficient documentation

## 2019-12-02 MED ORDER — SODIUM CHLORIDE 0.9% FLUSH: 3.0000 mL | Freq: Once | INTRAVENOUS | Status: DC

## 2019-12-02 NOTE — ED Triage Notes (Signed)
Per EMS, pt had ETOH, marijuana and smoked crack tonight after 5 months being "clean."  Pt began to halluciate at home believing that he had spiders all over his body/face, his mother had to restrain him from scratching his face and torso.  Pt began to complain of "crushing chest pain."  EMS gave 2 nitro (which decreased the pain)  Also given about 100cc NS, 324 ASA.  Pt is only oriented to self and expresses remorse for the relapse.    Wilburn Cornelia - support person [phone]  HR 110 CBG 117 134/90 (after two nitro) 98% RA

## 2019-12-03 ENCOUNTER — Emergency Department (HOSPITAL_COMMUNITY): Payer: Self-pay

## 2019-12-03 LAB — BASIC METABOLIC PANEL
Anion gap: 15 (ref 5–15)
BUN: 18 mg/dL (ref 6–20)
CO2: 20 mmol/L — ABNORMAL LOW (ref 22–32)
Calcium: 9 mg/dL (ref 8.9–10.3)
Chloride: 105 mmol/L (ref 98–111)
Creatinine, Ser: 1.23 mg/dL (ref 0.61–1.24)
GFR calc Af Amer: >60 mL/min (ref >60)
GFR calc non Af Amer: >60 mL/min (ref >60)
Glucose, Bld: 119 mg/dL — ABNORMAL HIGH (ref 70–99)
Potassium: 3.9 mmol/L (ref 3.5–5.1)
Sodium: 140 mmol/L (ref 135–145)

## 2019-12-03 LAB — CBC
HCT: 43.2 % (ref 39.0–52.0)
Hemoglobin: 15.3 g/dL (ref 13.0–17.0)
MCH: 32.1 pg (ref 26.0–34.0)
MCHC: 35.4 g/dL (ref 30.0–36.0)
MCV: 90.6 fL (ref 80.0–100.0)
Platelets: 319 10*3/uL (ref 150–400)
RBC: 4.77 MIL/uL (ref 4.22–5.81)
RDW: 13 % (ref 11.5–15.5)
WBC: 5.9 10*3/uL (ref 4.0–10.5)
nRBC: 0 % (ref 0.0–0.2)

## 2019-12-03 LAB — TROPONIN I (HIGH SENSITIVITY): Troponin I (High Sensitivity): <2 ng/L (ref <18)

## 2019-12-03 NOTE — ED Notes (Signed)
[employer] stage manager took IV out and pt left AMA

## 2019-12-03 NOTE — ED Notes (Signed)
Pt stated he wanted to go home and go to bed. RN spoke to both pt and family stating that we could not force him to stay.  Both pt and family agreed to return should his symptoms return.

## 2019-12-03 NOTE — ED Notes (Signed)
[phone] Johnny Harper would like a call with an update on patient once an update is available

## 2019-12-03 NOTE — ED Notes (Signed)
[phone] Johnny Harper would like a call. This is her number.

## 2021-03-15 ENCOUNTER — Encounter (HOSPITAL_COMMUNITY): Payer: Self-pay

## 2021-03-15 ENCOUNTER — Other Ambulatory Visit: Payer: Self-pay

## 2021-03-15 ENCOUNTER — Emergency Department (HOSPITAL_COMMUNITY)
Admission: EM | Admit: 2021-03-15 | Discharge: 2021-03-15 | Disposition: A | Discharge status: Home / Self Care | Payer: Self-pay | Attending: Emergency Medicine | Admitting: Emergency Medicine

## 2021-03-15 DIAGNOSIS — L2489 Irritant contact dermatitis due to other agents: Secondary | ICD-10-CM | POA: Insufficient documentation

## 2021-03-15 MED ORDER — PREDNISONE 10 MG (21) PO TBPK: ORAL_TABLET | Freq: Every day | ORAL | 0 refills | Status: DC

## 2021-03-15 NOTE — Discharge Instructions (Signed)
You were seen in the ER for rash  This rash is likely from sensitivity reaction to new hygiene products  Stop all hygiene products. Rinse skin with fragrance free antibacterial soap (dove).  Use fragrance free gentle moisturizer throughout, can also use vaseline on more dry areas  Take prednisone taper as prescribed  Take 25 mg diphenhydramine (benadryl) every 6 hours for the next 72 hr.  Additionally take over the counter allergy medicine in the AM and PM (ie: claritin, zyrtec, allegra, etc)  Rash should improve in the next 48-72 hrs  Lukewarm / cool showers. Avoid hot baths or hot tubs.  Wear loose clothing.  Return for worsening rash, facial lip tongue or throat swelling, chest pain, shortness of breath, fevers

## 2021-03-15 NOTE — ED Notes (Signed)
Patient and belongings not found in room. 

## 2021-03-15 NOTE — ED Triage Notes (Signed)
Pt arrived via walk in, c/o rash x3 days. States he started using new lotion couple days ago right before rash appeared. Has taken benadryl with little relief.

## 2021-03-15 NOTE — ED Provider Notes (Signed)
Patient eloped prior to me seeing the patient.  Patient was NOT  evaluated by provider.   Farrel Gordon, PA-C 03/15/21 1554    Charlynne Pander, MD 03/15/21 [phone]

## 2021-03-15 NOTE — ED Notes (Signed)
Patient found in room.

## 2021-03-15 NOTE — ED Provider Notes (Signed)
[facility]-EMERGENCY DEPT Provider Note   CSN: [id number] Arrival date & time: 03/15/21  1427     History Chief Complaint  Patient presents with  . Rash    Johnny Harper is a 40 y.o. male presents to the ED for evaluation of rash that began 2 days ago.  Rash is located in his arms, legs and thighs, neck, top of his ears.  Rash is itchy, dry.  States 2 days ago he started using a new cologne, lotion and soap.  Has taken Benadryl without improvement.  Denies fevers, chills.  Denies oral lesions, lip tongue or throat swelling.  No sick contacts with similar rash.  No exposure to ticks.  Denies recent illness.  No known allergies.  HPI     History reviewed. No pertinent past medical history.  Patient Active Problem List   Diagnosis Date Noted  . Substance induced mood disorder (HCC) 10/17/2015    Past Surgical History:  Procedure Laterality Date  . ROOT CANAL     2014, left upper       Family History  Problem Relation Age of Onset  . Hypertension Other   . Diabetes Other     Social History   Tobacco Use  . Smoking status: Never Smoker  . Smokeless tobacco: Never Used  Substance Use Topics  . Alcohol use: Yes    Comment: Xanax and Molly  . Drug use: No    Home Medications Prior to Admission medications   Medication Sig Start Date End Date Taking? Authorizing Provider  predniSONE (STERAPRED UNI-PAK 21 TAB) 10 MG (21) TBPK tablet Take by mouth daily. Take 6 tabs by mouth daily  for 2 days, then 5 tabs for 2 days, then 4 tabs for 2 days, then 3 tabs for 2 days, 2 tabs for 2 days, then 1 tab by mouth daily for 2 days 03/15/21  Yes Liberty Handy, PA-C  traZODone (DESYREL) 50 MG tablet Take 1 tablet (50 mg total) by mouth at bedtime and may repeat dose one time if needed. 10/18/15   Adonis Brook, NP    Allergies    Patient has no known allergies.  Review of Systems   Review of Systems  Skin: Positive for rash.  All other systems  reviewed and are negative.   Physical Exam Updated Vital Signs BP 137/89 (BP Location: Right Arm)   Pulse 99   Temp 98.2 F (36.8 C) (Oral)   Resp 18   SpO2 100%   Physical Exam Vitals and nursing note reviewed.  Constitutional:      General: He is not in acute distress.    Appearance: He is well-developed.     Comments: NAD.  HENT:     Head: Normocephalic and atraumatic.     Right Ear: External ear normal.     Left Ear: External ear normal.     Nose: Nose normal.     Mouth/Throat:     Comments: No obvious lip, tongue or oropharyngeal edema.  No intraoral lesions. Eyes:     General: No scleral icterus.    Conjunctiva/sclera: Conjunctivae normal.  Cardiovascular:     Rate and Rhythm: Normal rate and regular rhythm.     Heart sounds: Normal heart sounds. No murmur heard.   Pulmonary:     Effort: Pulmonary effort is normal.     Breath sounds: Normal breath sounds. No wheezing.  Musculoskeletal:        General: No deformity. Normal range of  motion.     Cervical back: Normal range of motion and neck supple.  Skin:    General: Skin is warm and dry.     Capillary Refill: Capillary refill takes less than 2 seconds.     Findings: Rash present.     Comments: Diffuse erythematous dry nontender maculopapular rash in upper extremities, lower extremities, posterior neck and top of the ears.  No vesicular lesions.  No warmth.  No lesions in the palms or soles.  Neurological:     Mental Status: He is alert and oriented to person, place, and time.  Psychiatric:        Behavior: Behavior normal.        Thought Content: Thought content normal.        Judgment: Judgment normal.     ED Results / Procedures / Treatments   Labs (all labs ordered are listed, but only abnormal results are displayed) Labs Reviewed - No data to display  EKG None  Radiology No results found.  Procedures Procedures   Medications Ordered in ED Medications - No data to display  ED Course  I  have reviewed the triage vital signs and the nursing notes.  Pertinent labs & imaging results that were available during my care of the patient were reviewed by me and considered in my medical decision making (see chart for details).    MDM Rules/Calculators/A&P                          40 year old male presents to the ED for diffuse pruritic rash that began after using new hygiene products.  Exam and history most consistent with likely contact dermatitis or other hypersensitivity reaction to hygiene products.  Given that rash is affecting several areas of his body will discharge with prednisone taper.  Also recommended Benadryl every 6 and Claritin twice daily, lukewarm/cool baths, moisturizer and Vaseline and cessation of any hygiene products.  He is not reporting any symptoms to suggest anaphylaxis.  Considered infectious rash, vasculitis or other dermatological emergency unlikely.  Return precautions discussed.  Patient is comfortable with plan.  Final Clinical Impression(s) / ED Diagnoses Final diagnoses:  Irritant contact dermatitis due to other agents    Rx / DC Orders ED Discharge Orders         Ordered    predniSONE (STERAPRED UNI-PAK 21 TAB) 10 MG (21) TBPK tablet  Daily        03/15/21 1845           Liberty Handy, PA-C 03/15/21 Carlis Stable    Mancel Bale, MD 03/16/21 1044

## 2021-08-20 ENCOUNTER — Emergency Department (HOSPITAL_COMMUNITY)
Admission: EM | Admit: 2021-08-20 | Discharge: 2021-08-20 | Disposition: A | Discharge status: Home / Self Care | Payer: Self-pay | Attending: Emergency Medicine | Admitting: Emergency Medicine

## 2021-08-20 ENCOUNTER — Other Ambulatory Visit: Payer: Self-pay

## 2021-08-20 ENCOUNTER — Emergency Department (HOSPITAL_COMMUNITY): Payer: Self-pay

## 2021-08-20 DIAGNOSIS — W25XXXA Contact with sharp glass, initial encounter: Secondary | ICD-10-CM | POA: Insufficient documentation

## 2021-08-20 DIAGNOSIS — S61212A Laceration without foreign body of right middle finger without damage to nail, initial encounter: Secondary | ICD-10-CM | POA: Insufficient documentation

## 2021-08-20 DIAGNOSIS — Z23 Encounter for immunization: Secondary | ICD-10-CM | POA: Insufficient documentation

## 2021-08-20 DIAGNOSIS — S61214A Laceration without foreign body of right ring finger without damage to nail, initial encounter: Secondary | ICD-10-CM | POA: Insufficient documentation

## 2021-08-20 MED ORDER — LIDOCAINE-EPINEPHRINE 2 %-1:100000 IJ SOLN
20.0000 mL | Freq: Once | INTRAMUSCULAR | Status: AC
  Administered 2021-08-20: 20 mL
  Filled 2021-08-20: qty 1

## 2021-08-20 MED ORDER — TETANUS-DIPHTH-ACELL PERTUSSIS 5-2.5-18.5 LF-MCG/0.5 IM SUSY
0.5000 mL | PREFILLED_SYRINGE | Freq: Once | INTRAMUSCULAR | Status: AC
  Administered 2021-08-20: 0.5 mL via INTRAMUSCULAR
  Filled 2021-08-20: qty 0.5

## 2021-08-20 MED ORDER — CEPHALEXIN 500 MG PO CAPS: 500.0000 mg | ORAL_CAPSULE | Freq: Four times a day (QID) | ORAL | 0 refills | Status: AC

## 2021-08-20 MED ORDER — CEPHALEXIN 500 MG PO CAPS
1000.0000 mg | ORAL_CAPSULE | Freq: Once | ORAL | Status: AC
  Administered 2021-08-20: 1000 mg via ORAL
  Filled 2021-08-20: qty 2

## 2021-08-20 NOTE — ED Notes (Signed)
Patient is now back in the room. Patient is not answering my questions and fixated on speaking to police.

## 2021-08-20 NOTE — ED Triage Notes (Signed)
Patient brought in by [employer]. Patient states he and his wife were arguing. Patient cut his hand on the window.

## 2021-08-20 NOTE — ED Notes (Signed)
Patient was willing to receive treatment. Xray called. Patient wound is currently being re-cleaned.

## 2021-08-20 NOTE — ED Notes (Signed)
X-ray at bedside

## 2021-08-20 NOTE — ED Notes (Signed)
Patient was seen running down the hall and out the door. Patient was apprehended by [employer].

## 2021-08-20 NOTE — ED Notes (Signed)
Gave patients grandmother Neita Goodnight a call and update on the patients care, at the patients request. [phone]. Also spoke with father and updated him on patients care.

## 2021-08-20 NOTE — ED Notes (Addendum)
Patient has wounds on the right hand. Patient refuses to answer any questions at this time stating he only wants to speak to his kids. Patient is currently in police custody.

## 2021-08-20 NOTE — ED Provider Notes (Signed)
[facility]-EMERGENCY DEPT Provider Note: Johnny Dell, MD, FACEP  CSN: [id number] MRN: [id number] ARRIVAL: 08/20/21 at 0003 ROOM: [facility]17/[facility]17   CHIEF COMPLAINT  Hand Injury   HISTORY OF PRESENT ILLNESS  08/20/21 12:09 AM Johnny Harper is a 40 y.o. male in police custody who was having an argument with his wife yesterday evening.  In the process of moving a sliding glass door it shattered, cutting his right hand.  He has lacerations to his dorsal right third and fourth fingers.  There is no associated sensory or functional deficit.  Pain is moderate, worse with movement.  He is not sure of his tetanus status.    No past medical history on file.  Past Surgical History:  Procedure Laterality Date   ROOT CANAL     2014, left upper    Family History  Problem Relation Age of Onset   Hypertension Other    Diabetes Other     Social History   Tobacco Use   Smoking status: Never   Smokeless tobacco: Never  Substance Use Topics   Alcohol use: Yes    Comment: Xanax and Molly   Drug use: No    Prior to Admission medications   Medication Sig Start Date End Date Taking? Authorizing Provider  cephALEXin (KEFLEX) 500 MG capsule Take 1 capsule (500 mg total) by mouth 4 (four) times daily. 08/20/21  Yes Aundria Bitterman, MD    Allergies Patient has no known allergies.   REVIEW OF SYSTEMS  Negative except as noted here or in the History of Present Illness.   PHYSICAL EXAMINATION  Initial Vital Signs There were no vitals taken for this visit.  Examination General: Well-developed, well-nourished male in no acute distress; appearance consistent with age of record HENT: normocephalic; atraumatic Eyes: pupils equal, round and reactive to light; extraocular muscles intact Neck: supple Heart: regular rate and rhythm Lungs: Respiratory effort and excursion Abdomen: soft; nondistended Extremities: No deformity; lacerations to dorsal right third and fourth fingers with intact tendon  function, fingers neurovascularly intact distally:    Neurologic: Awake, alert and oriented; motor function intact in all extremities and symmetric; no facial droop Skin: Warm and dry Psychiatric: Angry at arresting officers but pleasant and cooperative towards myself and other staff   RESULTS  Summary of this visit's results, reviewed and interpreted by myself:   EKG Interpretation  Date/Time:    Ventricular Rate:    PR Interval:    QRS Duration:   QT Interval:    QTC Calculation:   R Axis:     Text Interpretation:         Laboratory Studies: No results found for this or any previous visit (from the past 24 hour(s)). Imaging Studies: DG Hand Complete Right  Result Date: 08/20/2021 CLINICAL DATA:  Laceration with glass EXAM: RIGHT HAND - COMPLETE 3+ VIEW COMPARISON:  None. FINDINGS: There is no evidence of fracture or dislocation. There is no evidence of arthropathy or other focal bone abnormality. There is no radiopaque foreign body. IMPRESSION: Negative. No radiopaque foreign body. Electronically Signed   By: Deatra Robinson M.D.   On: 08/20/2021 01:07    ED COURSE and MDM  Nursing notes, initial and subsequent vitals signs, including pulse oximetry, reviewed and interpreted by myself.  Vitals:   08/20/21 0016 08/20/21 0037  BP:  (!) 160/111  Pulse:  (!) 122  Resp: 20 18  Temp:  97.9 F (36.6 C)  TempSrc:  Axillary  SpO2:  97%  Medications  cephALEXin (KEFLEX) capsule 1,000 mg (1,000 mg Oral Given 08/20/21 0040)  Tdap (BOOSTRIX) injection 0.5 mL (0.5 mLs Intramuscular Given 08/20/21 0040)  lidocaine-EPINEPHrine (XYLOCAINE W/EPI) 2 %-1:100000 (with pres) injection 20 mL (20 mLs Infiltration Given 08/20/21 0106)   Wounds closed as described below.  Patient started on Keflex for infection prophylaxis.  Fingers buddy taped to limit flexion and extension while wounds heal.  No evidence of tendon damage.   PROCEDURES  Procedures LACERATION REPAIR Performed by: Carlisle Beers  Yasin Ducat Authorized by: Carlisle Beers Hurley Blevins Consent: Verbal consent obtained. Risks and benefits: risks, benefits and alternatives were discussed Consent given by: patient Patient identity confirmed: provided demographic data Prepped and Draped in normal sterile fashion Wound explored, extensor tendons exposed but no tendon damage seen  Laceration Location: Right middle finger  Laceration Length: 6 cm  No Foreign Bodies seen or palpated  Anesthesia: local infiltration  Local anesthetic: lidocaine 2% with epinephrine  Anesthetic total: 6 ml  Irrigation method: syringe Amount of cleaning: standard  Skin closure: 4-0 Vicryl  Number of sutures: 14  Technique: Simple interrupted  Patient tolerance: Patient tolerated the procedure well with no immediate complications.  LACERATION REPAIR Performed by: Carlisle Beers Normal Recinos Authorized by: Carlisle Beers Matti Killingsworth Consent: Verbal consent obtained. Risks and benefits: risks, benefits and alternatives were discussed Consent given by: patient Patient identity confirmed: provided demographic data Prepped and Draped in normal sterile fashion Wound explored  Laceration Location: Right ring finger  Laceration Length: 2 through cm  No Foreign Bodies seen or palpated  Anesthesia: local infiltration  Local anesthetic: lidocaine 2% with epinephrine  Anesthetic total: 2 ml  Irrigation method: syringe Amount of cleaning: standard  Skin closure: 4-0 Vicryl  Number of sutures: 5  Technique: Simple interrupted  Patient tolerance: Patient tolerated the procedure well with no immediate complications.     ED DIAGNOSES     ICD-10-CM   1. Injury from broken glass, initial encounter  W25.XXXA     2. Laceration of right ring finger without foreign body without damage to nail, initial encounter  S61.214A     3. Laceration of right middle finger without foreign body without damage to nail, initial encounter  U98.119J          Paula Libra,  MD 08/20/21 [phone]

## 2021-08-20 NOTE — ED Notes (Signed)
The patient refused all vital signs.
